# Patient Record
Sex: Female | Born: 1995 | Race: White | Hispanic: No | Marital: Single | Smoking: Never smoker
Health system: Southern US, Community
[De-identification: ages and names within clinical notes are randomized; demographics above are authoritative.]

## PROBLEM LIST (undated history)

## (undated) HISTORY — PX: WISDOM TOOTH EXTRACTION: SHX21

---

## 2014-06-14 ENCOUNTER — Emergency Department (HOSPITAL_BASED_OUTPATIENT_CLINIC_OR_DEPARTMENT_OTHER): Payer: BC Managed Care – PPO

## 2014-06-14 ENCOUNTER — Emergency Department (HOSPITAL_BASED_OUTPATIENT_CLINIC_OR_DEPARTMENT_OTHER)
Admission: EM | Admit: 2014-06-14 | Discharge: 2014-06-15 | Disposition: A | Discharge status: Home / Self Care | Payer: BC Managed Care – PPO | Attending: Emergency Medicine | Admitting: Emergency Medicine

## 2014-06-14 ENCOUNTER — Encounter (HOSPITAL_BASED_OUTPATIENT_CLINIC_OR_DEPARTMENT_OTHER): Payer: Self-pay | Admitting: Emergency Medicine

## 2014-06-14 DIAGNOSIS — Z3202 Encounter for pregnancy test, result negative: Secondary | ICD-10-CM | POA: Insufficient documentation

## 2014-06-14 DIAGNOSIS — R1031 Right lower quadrant pain: Secondary | ICD-10-CM

## 2014-06-14 DIAGNOSIS — K5904 Chronic idiopathic constipation: Secondary | ICD-10-CM

## 2014-06-14 DIAGNOSIS — Z880 Allergy status to penicillin: Secondary | ICD-10-CM | POA: Insufficient documentation

## 2014-06-14 LAB — URINALYSIS, ROUTINE W REFLEX MICROSCOPIC
BILIRUBIN URINE: NEGATIVE
Glucose, UA: NEGATIVE mg/dL
Hgb urine dipstick: NEGATIVE
Ketones, ur: NEGATIVE mg/dL
Leukocytes, UA: NEGATIVE
NITRITE: NEGATIVE
PH: 6 (ref 5.0–8.0)
Protein, ur: NEGATIVE mg/dL
SPECIFIC GRAVITY, URINE: 1.027 (ref 1.005–1.030)
Urobilinogen, UA: 1 mg/dL (ref 0.0–1.0)

## 2014-06-14 LAB — PREGNANCY, URINE: Preg Test, Ur: NEGATIVE

## 2014-06-14 NOTE — ED Notes (Signed)
Pt c/o right lower abd pain x 2 days  Pt denies n/v or fever urinary symptoms.

## 2014-06-14 NOTE — ED Provider Notes (Signed)
CSN: [id number]     Arrival date & time 06/14/14  2013 History   This chart was scribed for Dagmar HaitWilliam Laverta Harnisch, MD, by Yevette EdwardsAngela Bracken, ED Scribe. This patient was seen in room MH09/MH09 and the patient's care was started at 9:51 PM.  First MD Initiated Contact with Patient 06/14/14 2141     Chief Complaint  Patient presents with  . Abdominal Pain    Patient is a 18 y.o. female presenting with abdominal pain. The history is provided by the patient. No language interpreter was used.  Abdominal Pain Pain location:  RLQ Pain quality: sharp   Pain severity:  Mild Onset quality:  Sudden Duration:  2 days Timing:  Rare Progression:  Improving Chronicity:  New Context: not recent sexual activity and not retching   Relieved by:  Nothing Worsened by:  Nothing tried Ineffective treatments:  None tried Associated symptoms: no dysuria, no fever, no hematuria, no nausea and no vomiting   Risk factors: not elderly, not obese and not pregnant    HPI Comments: Kathryn Jenkins is a 18 y.o. female who presents to the Emergency Department complaining of RLQ abdominal pain which first occurred yesterday evening and which she characterizes as "sharp." The pain resolved spontaneously yesterday evening, and then returned suddenly this evening after micturition. She reports two locations of pain this evening, one in the RLQ which she characterizes as a mild, "throbbing" pain similar to yesterday evening's pain. She also experienced "burning," severe pain inferior to her umbilicus.  Kathryn Jenkins reports the pain is increased with deep inspiration, but the pain is currently improving at bedside. She denies radiation of the pain to her back or groin. The pt denies nausea, emesis, fever, diarrhea, or urinary symptoms. She denies a h/o similar symptoms. The pt has not had a menses for four months, and she reports a prior episode of dysmenorrhea two years ago and possible POCT. Kathryn Jenkins denies sexual activity, and she  denies a h/o pelvic exams.   History reviewed. No pertinent past medical history. History reviewed. No pertinent past surgical history. History reviewed. No pertinent family history. History  Substance Use Topics  . Smoking status: Never Smoker   . Smokeless tobacco: Not on file  . Alcohol Use: No   No OB history provided.  Review of Systems  Constitutional: Negative for fever.  Gastrointestinal: Positive for abdominal pain. Negative for nausea and vomiting.  Genitourinary: Negative for dysuria, hematuria and vaginal pain.  Musculoskeletal: Negative for back pain.  All other systems reviewed and are negative.   Allergies  Penicillins  Home Medications   Prior to Admission medications   Not on File   Triage Vitals: BP 123/63  Pulse 73  Temp(Src) 98 F (36.7 C) (Oral)  Resp 18  Ht 5' 5.5" (1.664 m)  Wt 150 lb (68.04 kg)  BMI 24.57 kg/m2  SpO2 99%  Physical Exam  Nursing note and vitals reviewed. Constitutional: She is oriented to person, place, and time. She appears well-developed and well-nourished. No distress.  HENT:  Head: Normocephalic and atraumatic.  Eyes: Conjunctivae and EOM are normal.  Neck: Neck supple. No tracheal deviation present.  Cardiovascular: Normal rate.   Pulmonary/Chest: Effort normal. No respiratory distress.  Musculoskeletal: Normal range of motion.  Neurological: She is alert and oriented to person, place, and time.  Skin: Skin is warm and dry.  Psychiatric: She has a normal mood and affect. Her behavior is normal.    ED Course  Procedures (including critical care  time)  DIAGNOSTIC STUDIES: Oxygen Saturation is 99% on room air, normal by my interpretation.    COORDINATION OF CARE:  10:06 PM- Discussed treatment plan with patient, and the patient agreed to the plan. The plan includes an ultrasound and CT if [place] is negative.   Labs Review Labs Reviewed  URINALYSIS, ROUTINE W REFLEX MICROSCOPIC  PREGNANCY, URINE    Imaging  Review US Pelvis Complete  06/15/2014   CLINICAL DATA:  Sudden onset of right lower quadrant abdominal pain and pelvic pain.  EXAM: TRANSABDOMINAL ULTRASOUND OF PELVIS  DOPPLER ULTRASOUND OF OVARIES  TECHNIQUE: Transabdominal ultrasound examination of the pelvis was performed including evaluation of the uterus, ovaries, adnexal regions, and pelvic cul-de-sac.  Color and duplex Doppler ultrasound was utilized to evaluate blood flow to the ovaries.  COMPARISON:  None.  FINDINGS: Uterus  Measurements: 8.2 x 3.9 x 4.3 cm No fibroids or other mass visualized.  Endometrium  Thickness: 1.7 cm.  No focal abnormality visualized.  Right ovary  Measurements: 4.1 x 1.8 x 2.5 cm Normal appearance/no adnexal mass.  Left ovary  Measurements: 4.0 x 2.2 x 3.1 cm There appears to be a partially decompressed follicle on the left side.  Pulsed Doppler evaluation demonstrates normal low-resistance arterial and venous waveforms in both ovaries.  Trace free fluid is noted at both adnexa.  IMPRESSION: 1. Unremarkable pelvic ultrasound.  No evidence for ovarian torsion. 2. Trace free fluid at both adnexa, likely physiologic in nature.   Electronically Signed   By: Roanna Raider M.D.   On: 06/15/2014 00:22   [place] Art/ven Flow Abd Pelv Doppler  06/15/2014   CLINICAL DATA:  Sudden onset of right lower quadrant abdominal pain and pelvic pain.  EXAM: TRANSABDOMINAL ULTRASOUND OF PELVIS  DOPPLER ULTRASOUND OF OVARIES  TECHNIQUE: Transabdominal ultrasound examination of the pelvis was performed including evaluation of the uterus, ovaries, adnexal regions, and pelvic cul-de-sac.  Color and duplex Doppler ultrasound was utilized to evaluate blood flow to the ovaries.  COMPARISON:  None.  FINDINGS: Uterus  Measurements: 8.2 x 3.9 x 4.3 cm No fibroids or other mass visualized.  Endometrium  Thickness: 1.7 cm.  No focal abnormality visualized.  Right ovary  Measurements: 4.1 x 1.8 x 2.5 cm Normal appearance/no adnexal mass.  Left ovary   Measurements: 4.0 x 2.2 x 3.1 cm There appears to be a partially decompressed follicle on the left side.  Pulsed Doppler evaluation demonstrates normal low-resistance arterial and venous waveforms in both ovaries.  Trace free fluid is noted at both adnexa.  IMPRESSION: 1. Unremarkable pelvic ultrasound.  No evidence for ovarian torsion. 2. Trace free fluid at both adnexa, likely physiologic in nature.   Electronically Signed   By: Roanna Raider M.D.   On: 06/15/2014 00:22     EKG Interpretation None      MDM   Final diagnoses:  Right lower quadrant pain    69F here with RLQ pain. Episodic, happened last night, then again tonight. Tonight acutely happened, had her doubled over in pain. No dysuria, no N/V/D. No fever. Here low RLQ pain on exam. Deferred pelvic, had never had one before. Pelvic US normal, will scan to r/o appendicitis. Care to Dr. Nicanor Alcon.  I personally performed the services described in this documentation, which was scribed in my presence. The recorded information has been reviewed and is accurate.     Dagmar Hait, MD 06/15/14 [phone]

## 2014-06-14 NOTE — ED Notes (Signed)
Pt given 1000cc water to drink prior to US per EDP orders

## 2014-06-15 ENCOUNTER — Emergency Department (HOSPITAL_BASED_OUTPATIENT_CLINIC_OR_DEPARTMENT_OTHER): Payer: BC Managed Care – PPO

## 2014-06-15 LAB — BASIC METABOLIC PANEL
Anion gap: 12 (ref 5–15)
BUN: 15 mg/dL (ref 6–23)
CHLORIDE: 104 meq/L (ref 96–112)
CO2: 25 mEq/L (ref 19–32)
Calcium: 9.3 mg/dL (ref 8.4–10.5)
Creatinine, Ser: 0.9 mg/dL (ref 0.50–1.10)
GFR calc Af Amer: >90 mL/min (ref >90)
GFR calc non Af Amer: >90 mL/min (ref >90)
Glucose, Bld: 92 mg/dL (ref 70–99)
POTASSIUM: 3.7 meq/L (ref 3.7–5.3)
SODIUM: 141 meq/L (ref 137–147)

## 2014-06-15 LAB — CBC
HCT: 37.3 % (ref 36.0–46.0)
Hemoglobin: 12.7 g/dL (ref 12.0–15.0)
MCH: 27.1 pg (ref 26.0–34.0)
MCHC: 34 g/dL (ref 30.0–36.0)
MCV: 79.5 fL (ref 78.0–100.0)
PLATELETS: 241 10*3/uL (ref 150–400)
RBC: 4.69 MIL/uL (ref 3.87–5.11)
RDW: 16.2 % — ABNORMAL HIGH (ref 11.5–15.5)
WBC: 6.1 10*3/uL (ref 4.0–10.5)

## 2014-06-15 MED ORDER — IOHEXOL 300 MG/ML  SOLN
100.0000 mL | Freq: Once | INTRAMUSCULAR | Status: AC | PRN
  Administered 2014-06-15: 100 mL via INTRAVENOUS

## 2014-06-15 MED ORDER — NAPROXEN 375 MG PO TABS
375.0000 mg | ORAL_TABLET | Freq: Two times a day (BID) | ORAL | Status: DC
Start: 2014-06-15 — End: 2015-06-21

## 2014-06-15 MED ORDER — SODIUM CHLORIDE 0.9 % IV BOLUS (SEPSIS)
500.0000 mL | Freq: Once | INTRAVENOUS | Status: AC
  Administered 2014-06-15: 500 mL via INTRAVENOUS

## 2014-06-15 MED ORDER — IOHEXOL 300 MG/ML  SOLN
50.0000 mL | Freq: Once | INTRAMUSCULAR | Status: AC | PRN
  Administered 2014-06-15: 50 mL via ORAL

## 2014-06-15 NOTE — Discharge Instructions (Signed)

## 2015-06-21 ENCOUNTER — Ambulatory Visit (INDEPENDENT_AMBULATORY_CARE_PROVIDER_SITE_OTHER): Payer: 59 | Admitting: Obstetrics and Gynecology

## 2015-06-21 ENCOUNTER — Encounter: Payer: Self-pay | Admitting: Obstetrics and Gynecology

## 2015-06-21 VITALS — BP 110/70 | HR 64 | Resp 14 | Ht 65.0 in | Wt 160.0 lb

## 2015-06-21 DIAGNOSIS — N915 Oligomenorrhea, unspecified: Secondary | ICD-10-CM

## 2015-06-21 DIAGNOSIS — L680 Hirsutism: Secondary | ICD-10-CM | POA: Insufficient documentation

## 2015-06-21 DIAGNOSIS — Z01419 Encounter for gynecological examination (general) (routine) without abnormal findings: Secondary | ICD-10-CM | POA: Diagnosis not present

## 2015-06-21 MED ORDER — DROSPIRENONE-ETHINYL ESTRADIOL 3-0.02 MG PO TABS: 1.0000 | ORAL_TABLET | Freq: Every day | ORAL | Status: DC

## 2015-06-21 NOTE — Patient Instructions (Addendum)
Call if you go 2 months without a cycle  EXERCISE AND DIET:  We recommended that you start or continue a regular exercise program for good health. Regular exercise means any activity that makes your heart beat faster and makes you sweat.  We recommend exercising at least 30 minutes per day at least 3 days a week, preferably 4 or 5.  We also recommend a diet low in fat and sugar.  Inactivity, poor dietary choices and obesity can cause diabetes, heart attack, stroke, and kidney damage, among others.    ALCOHOL AND SMOKING:  Women should limit their alcohol intake to no more than 7 drinks/beers/glasses of wine (combined, not each!) per week. Moderation of alcohol intake to this level decreases your risk of breast cancer and liver damage. And of course, no recreational drugs are part of a healthy lifestyle.  And absolutely no smoking or even second hand smoke. Most people know smoking can cause heart and lung diseases, but did you know it also contributes to weakening of your bones? Aging of your skin?  Yellowing of your teeth and nails?  CALCIUM AND VITAMIN D:  Adequate intake of calcium and Vitamin D are recommended.  The recommendations for exact amounts of these supplements seem to change often, but generally speaking 600 mg of calcium (either carbonate or citrate) and 800 units of Vitamin D per day seems prudent. Certain women may benefit from higher intake of Vitamin D.  If you are among these women, your doctor will have told you during your visit.    PAP SMEARS:  Pap smears, to check for cervical cancer or precancers,  have traditionally been done yearly, although recent scientific advances have shown that most women can have pap smears less often.  However, every woman still should have a physical exam from her gynecologist every year. It will include a breast check, inspection of the vulva and vagina to check for abnormal growths or skin changes, a visual exam of the cervix, and then an exam to  evaluate the size and shape of the uterus and ovaries.  And after 20 years of age, a rectal exam is indicated to check for rectal cancers. We will also provide age appropriate advice regarding health maintenance, like when you should have certain vaccines, screening for sexually transmitted diseases, bone density testing, colonoscopy, mammograms, etc.   Oral Contraception Information Oral contraceptive pills (OCPs) are medicines taken to prevent pregnancy. OCPs work by preventing the ovaries from releasing eggs. The hormones in OCPs also cause the cervical mucus to thicken, preventing the sperm from entering the uterus. The hormones also cause the uterine lining to become thin, not allowing a fertilized egg to attach to the inside of the uterus. OCPs are highly effective when taken exactly as prescribed. However, OCPs do not prevent sexually transmitted diseases (STDs). Safe sex practices, such as using condoms along with the pill, can help prevent STDs.  Before taking the pill, you may have a physical exam and Pap test. Your health care provider may order blood tests. The health care provider will make sure you are a good candidate for oral contraception. Discuss with your health care provider the possible side effects of the OCP you may be prescribed. When starting an OCP, it can take 2 to 3 months for the body to adjust to the changes in hormone levels in your body.  TYPES OF ORAL CONTRACEPTION  The combination pill--This pill contains estrogen and progestin (synthetic progesterone) hormones. The combination pill comes  in 21-day, 28-day, or 91-day packs. Some types of combination pills are meant to be taken continuously (365-day pills). With 21-day packs, you do not take pills for 7 days after the last pill. With 28-day packs, the pill is taken every day. The last 7 pills are without hormones. Certain types of pills have more than 21 hormone-containing pills. With 91-day packs, the first 84 pills contain  both hormones, and the last 7 pills contain no hormones or contain estrogen only.  The minipill--This pill contains the progesterone hormone only. The pill is taken every day continuously. It is very important to take the pill at the same time each day. The minipill comes in packs of 28 pills. All 28 pills contain the hormone.  ADVANTAGES OF ORAL CONTRACEPTIVE PILLS  Decreases premenstrual symptoms.   Treats menstrual period cramps.   Regulates the menstrual cycle.   Decreases a heavy menstrual flow.   May treatacne, depending on the type of pill.   Treats abnormal uterine bleeding.   Treats polycystic ovarian syndrome.   Treats endometriosis.   Can be used as emergency contraception.  THINGS THAT CAN MAKE ORAL CONTRACEPTIVE PILLS LESS EFFECTIVE OCPs can be less effective if:   You forget to take the pill at the same time every day.   You have a stomach or intestinal disease that lessens the absorption of the pill.   You take OCPs with other medicines that make OCPs less effective, such as antibiotics, certain HIV medicines, and some seizure medicines.   You take expired OCPs.   You forget to restart the pill on day 7, when using the packs of 21 pills.  RISKS ASSOCIATED WITH ORAL CONTRACEPTIVE PILLS  Oral contraceptive pills can sometimes cause side effects, such as:  Headache.  Nausea.  Breast tenderness.  Irregular bleeding or spotting. Combination pills are also associated with a small increased risk of:  Blood clots.  Heart attack.  Stroke. Document Released: 01/31/2003 Document Revised: 08/31/2013 Document Reviewed: 05/01/2013 [facility] Patient Information 2015 [place], [place]. This information is not intended to replace advice given to you by your health care provider. Make sure you discuss any questions you have with your health care provider.  Polycystic Ovarian Syndrome Polycystic ovarian syndrome (PCOS) is a common hormonal disorder  among women of reproductive age. Most women with PCOS grow many small cysts on their ovaries. PCOS can cause problems with your periods and make it difficult to get pregnant. It can also cause an increased risk of miscarriage with pregnancy. If left untreated, PCOS can lead to serious health problems, such as diabetes and heart disease. CAUSES The cause of PCOS is not fully understood, but genetics may be a factor. SIGNS AND SYMPTOMS   Infrequent or no menstrual periods.   Inability to get pregnant (infertility) because of not ovulating.   Increased growth of hair on the face, chest, stomach, back, thumbs, thighs, or toes.   Acne, oily skin, or dandruff.   Pelvic pain.   Weight gain or obesity, usually carrying extra weight around the waist.   Type 2 diabetes.   High cholesterol.   High blood pressure.   Female-pattern baldness or thinning hair.   Patches of thickened and dark brown or black skin on the neck, arms, breasts, or thighs.   Tiny excess flaps of skin (skin tags) in the armpits or neck area.   Excessive snoring and having breathing stop at times while asleep (sleep apnea).   Deepening of the voice.   Gestational  diabetes when pregnant.  DIAGNOSIS  There is no single test to diagnose PCOS.   Your health care provider will:   Take a medical history.   Perform a pelvic exam.   Have ultrasonography done.   Check your female and female hormone levels.   Measure glucose or sugar levels in the blood.   Do other blood tests.   If you are producing too many female hormones, your health care provider will make sure it is from PCOS. At the physical exam, your health care provider will want to evaluate the areas of increased hair growth. Try to allow natural hair growth for a few days before the visit.   During a pelvic exam, the ovaries may be enlarged or swollen because of the increased number of small cysts. This can be seen more easily by  using vaginal ultrasonography or screening to examine the ovaries and lining of the uterus (endometrium) for cysts. The uterine lining may become thicker if you have not been having a regular period.  TREATMENT  Because there is no cure for PCOS, it needs to be managed to prevent problems. Treatments are based on your symptoms. Treatment is also based on whether you want to have a baby or whether you need contraception.  Treatment may include:   Progesterone hormone to start a menstrual period.   Birth control pills to make you have regular menstrual periods.   Medicines to make you ovulate, if you want to get pregnant.   Medicines to control your insulin.   Medicine to control your blood pressure.   Medicine and diet to control your high cholesterol and triglycerides in your blood.  Medicine to reduce excessive hair growth.  Surgery, making small holes in the ovary, to decrease the amount of female hormone production. This is done through a long, lighted tube (laparoscope) placed into the pelvis through a tiny incision in the lower abdomen.  HOME CARE INSTRUCTIONS  Only take over-the-counter or prescription medicine as directed by your health care provider.  Pay attention to the foods you eat and your activity levels. This can help reduce the effects of PCOS.  Keep your weight under control.  Eat foods that are low in carbohydrate and high in fiber.  Exercise regularly. SEEK MEDICAL CARE IF:  Your symptoms do not get better with medicine.  You have new symptoms. Document Released: 03/06/2005 Document Revised: 08/31/2013 Document Reviewed: 04/28/2013 [facility] Patient Information 2015 [place], [place]. This information is not intended to replace advice given to you by your health care provider. Make sure you discuss any questions you have with your health care provider.

## 2015-06-21 NOTE — Progress Notes (Signed)
Patient ID: Kathryn Jenkins, female   DOB: 17-Sep-1996, 19 y.o.   MRN: [id number] 19 y.o. No obstetric history on file. Single Caucasian female here for annual exam.  Pt c/o irregular menstrual cycles and weight gain. She also has noticed increased facial hair which concerns her. Menarche at age 70, slightly irregularly initially. She was on OCP's 8/15-12/16 for cycle control. When she went off menses q 4-5 weeks, then went 2 months in between cycles. Bleeds x 6-7 days. Saturates a super tampon in up to 2-3 hours. No breakthrough bleeding. Mild cramps, some back pain at times, tolerable.  Never sexually active. S/P gardasil x 3. She c/o hair growth on her chin, worsening in the last few years. She plucks or cuts it. No other concerning hair growth. She does c/o acne since puberty. She has gained about 26 lbs in the last 2 years. Mild headaches, no visual changes.  When she was on OCP's her Mom felt she was more moody.     PCP: Boneta Lucks, NP   Patient's last menstrual period was 06/11/2015.          Sexually active: No.  The current method of family planning is none.    Exercising: Yes.    strength training  3-4 x a week Smoker:  no  Health Maintenance: Pap:  N/A History of abnormal Pap:  N?A MMG:  N/A Colonoscopy:  N/A BMD:   N/A TDaP:  2007  Junior in college, biology major, wants to PA school.    reports that she has never smoked. She has never used smokeless tobacco. She reports that she does not drink alcohol or use illicit drugs.  History reviewed. No pertinent past medical history.  Past Surgical History  Procedure Laterality Date  . Wisdom tooth extraction      No current outpatient prescriptions on file.   No current facility-administered medications for this visit.    Family History  Problem Relation Age of Onset  . Thyroid disease Mother   . Thyroid disease Father   . Colon polyps Father   . Diabetes Paternal Aunt   . Diabetes Maternal Grandfather     ROS:   Pertinent items are noted in HPI.  Otherwise, a comprehensive ROS was negative.  Exam:   BP 110/70 mmHg  Pulse 64  Resp 14  Ht  (1.651 m)  Wt 160 lb (72.576 kg)  BMI 26.63 kg/m2  LMP 06/11/2015    General appearance: alert, cooperative and appears stated age Skin: moderate hirsutism under her chin, mild hirsutism on her lower abdomen. Acne noted on her face and back.  Head: Normocephalic, without obvious abnormality, atraumatic Neck: no adenopathy, supple, symmetrical, trachea midline and thyroid normal to inspection and palpation Lungs: clear to auscultation bilaterally Breasts: normal appearance, no masses or tenderness Heart: regular rate and rhythm Abdomen: soft, non-tender; bowel sounds normal; no masses,  no organomegaly Extremities: extremities normal, atraumatic, no cyanosis or edema Skin: Skin color, texture, turgor normal. No rashes or lesions Lymph nodes: Cervical, supraclavicular, and axillary nodes normal. No abnormal inguinal nodes palpated Neurologic: Grossly normal Pelvic: deferred  Assessment:   Well woman visit with normal exam. Acne Hirsutism Oligomenorrhea Suspect PCOS  Plan: TSH and prolactin  Hirsutism labs Discussed OCP's, can help with cycle control, acne and help prevent worsening hirsutism Discussed Yaz, including potentially increased risk of clotting compared to other OCP's, no contraindication Start Yaz F/U in 3 months Use condoms if she does become sexually active After visit summary  provided.

## 2015-06-22 LAB — DHEA-SULFATE: DHEA-SO4: 208 ug/dL (ref 51–321)

## 2015-06-22 LAB — TESTOSTERONE: Testosterone: 62 ng/dL (ref 10–70)

## 2015-06-22 LAB — TSH: TSH: 1.581 u[IU]/mL (ref 0.350–4.500)

## 2015-06-22 LAB — PROLACTIN: Prolactin: 18.9 ng/mL

## 2015-06-25 LAB — 17-HYDROXYPROGESTERONE: 17-OH-Progesterone, LC/MS/MS: 34 ng/dL

## 2015-09-10 ENCOUNTER — Encounter: Payer: Self-pay | Admitting: Obstetrics and Gynecology

## 2015-09-10 ENCOUNTER — Ambulatory Visit (INDEPENDENT_AMBULATORY_CARE_PROVIDER_SITE_OTHER): Payer: 59 | Admitting: Obstetrics and Gynecology

## 2015-09-10 VITALS — BP 114/82 | HR 66 | Resp 14 | Wt 150.0 lb

## 2015-09-10 DIAGNOSIS — Z3041 Encounter for surveillance of contraceptive pills: Secondary | ICD-10-CM | POA: Diagnosis not present

## 2015-09-10 MED ORDER — DROSPIRENONE-ETHINYL ESTRADIOL 3-0.02 MG PO TABS: 1.0000 | ORAL_TABLET | Freq: Every day | ORAL | Status: DC

## 2015-09-10 NOTE — Progress Notes (Signed)
Patient ID: Kathryn Jenkins, female   DOB: 07/13/1996, 19 y.o.   MRN: [id number] GYNECOLOGY  VISIT   HPI: 19 y.o.   Single  Caucasian  female   No obstetric history on file. with Patient's last menstrual period was 08/20/2015.   here for 3 month follow up birth control. When she was seen at her annual exam 3 months ago, she c/o oligomenorrhea and hirsutism. Her labs were normal. Never sexually active. She was started on Yaz. She is at the end of her 3rd pack. Doing well. She had cycles the first 2 months, bleed x 5-7 days. Moderate flow, changing a tampon in 3-4 hours (super). Slight spotting, getting better. No cramps. No mood changes. She has lost ablout 15 lbs in the last 3 months with dietary changes and exercise.  She has a boyfriend, considering being sexually active.   GYNECOLOGIC HISTORY: Patient's last menstrual period was 08/20/2015. Contraception:OCP Menopausal hormone therapy: N/A        OB History    No data available         Patient Active Problem List   Diagnosis Date Noted  . 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 Hirsutism 06/21/2015    History reviewed. No pertinent past medical history.  Past Surgical History  Procedure Laterality Date  . Wisdom tooth extraction      Current Outpatient Prescriptions  Medication Sig Dispense Refill  . drospirenone-ethinyl estradiol (YAZ,GIANVI,LORYNA) 3-0.02 MG tablet Take 1 tablet by mouth daily. 3 Package 2   No current facility-administered medications for this visit.     ALLERGIES: Penicillins  Family History  Problem Relation Age of Onset  . Thyroid disease Mother   . Thyroid disease Father   . Colon polyps Father   . Diabetes Paternal Aunt   . Diabetes Maternal Grandfather     Social History   Social History  . Marital Status: Single    Spouse Name: N/A  . Number of Children: N/A  . Years of Education: N/A   Occupational History  . Not on file.   Social History Main Topics  . Smoking status: Never Smoker   .  Smokeless tobacco: Never Used  . Alcohol Use: No  . Drug Use: No  . Sexual Activity: No   Other Topics Concern  . Not on file   Social History Narrative    Review of Systems  Gastrointestinal: Negative for abdominal pain.  Genitourinary: Negative for flank pain.  All other systems reviewed and are negative.   PHYSICAL EXAMINATION:    BP 114/82 mmHg  Pulse 66  Resp 14  Wt 150 lb (68.04 kg)  LMP 08/20/2015    General appearance: alert, cooperative and appears stated age  ASSESSMENT H/o oligomenorrhea and hirsutism, normal labs Here for f/u on OCP's, doing well, no c/o Considering being sexually active for the first time   PLAN Continue OCP's Discussed condoms, discussed using a lubricant Recommend her partner be tested for STD's prior to becoming sexually active S/P gardasil x 3   An After Visit Summary was printed and given to the patient.

## 2015-11-14 ENCOUNTER — Telehealth: Payer: Self-pay | Admitting: Obstetrics and Gynecology

## 2015-11-14 ENCOUNTER — Encounter: Payer: Self-pay | Admitting: Obstetrics and Gynecology

## 2015-11-14 NOTE — Telephone Encounter (Signed)
I agree, she should take a pregnancy test. As long as it is negative she should just continue her OCP's. She may have BTB the rest of this cycle.

## 2015-11-14 NOTE — Telephone Encounter (Signed)
Patient has missed two active birth control pill and is now having BTB. Patient is wondering if it is possible if she could be pregnant? Patient has not taken any OTC pregnancy test.

## 2015-11-14 NOTE — Telephone Encounter (Signed)
Spoke with patient at time of incoming call. [place]. Patient is currently taking Yaz for OCP. Patient states that two weeks ago she had intercourse 12/3. Used a condom as back up method.Patient took her pill after intercourse on 12/3. Missed pills on 12/4 and 12/5. Doubled up on her pills on 12/6 then was due to start a new pack. Threw out the last four pills in her pack and started a new pack with the first active pill. Has not missed any pills or taken any pills late since. Did not miss or take any pills late prior to intercourse. Patient began to have break through bleeding a couple of days ago. At first was bleeding like a normal cycle now states that the bleeding has slowed and has almost stopped. Patient is concerned about possible pregnancy. "He used a condom and it did not break, but he did have pre-cum on him before and my body was on his." Advised patient if there is any concern for pregnancy she needs to take a UPT. If pregnancy test if negative okay to continue taking OCP at the same time daily and not to miss any pills. If positive will need to contact our office. Patient is agreeable. All questions answered. Advised I will also speak with Dr.Jertson and return call with any additional information. Patient requests a mychart message be sent to her as she is unable to receive calls where she is at.

## 2015-11-14 NOTE — Telephone Encounter (Signed)
Please see other mychart encounter dated in today's date 11/14/2015 as this addresses the questions that are in this mychart encounter. Will close encounter.

## 2015-11-14 NOTE — Telephone Encounter (Signed)
Mychart message sent to patient per request as she is out of the country as seen below.  Kathryn Jenkins,  My name is Kathryn Jenkins. I am one of the triage nurses in the office with Dr.Jertson. We spoke earlier today. I am sending you a message to let you know I spoke with Dr.Jertson who also recommends you take a urine pregnancy test. If this is negative it is okay to continue taking your birth control as directed. Due to missing two pills it is not uncommon for you to have break through bleeding which may continue off and on during the rest of this pack of pills. If you have any additional questions please contact our office at [phone].  Thank you, Kathryn McalpineKaitlyn Hines, RN  Routing to provider for final review. Patient agreeable to disposition. Will close encounter.

## 2015-11-28 ENCOUNTER — Telehealth: Payer: Self-pay | Admitting: Obstetrics and Gynecology

## 2015-11-28 NOTE — Telephone Encounter (Signed)
Spoke with patient. Patient states that she is currently taking YAZ for birth control. Has been experiencing irregular BTB for one month. Has take a UPT which was negative. Patient would like to discuss alternative birth control options due to BTB and school schedule which makes it hard to take a pill. Advsied she will need to be seen in the office for a consultation. Patient is agreeable. Will return to college on 12/02/2015. Appointment scheduled for 11/29/2015 at 11:30 am with Dr.Jertson. Agreeable to date and time.  Routing to provider for final review. Patient agreeable to disposition. Will close encounter.

## 2015-11-28 NOTE — Telephone Encounter (Signed)
Patient is having some break through bleeeding on her current birth control. Patient is asking to talk with Dr.Jertson for alternative forms of birth control. Patient she may be interested in "the shot". Last seen 09/10/15.

## 2015-11-29 ENCOUNTER — Other Ambulatory Visit: Payer: Self-pay | Admitting: *Deleted

## 2015-11-29 ENCOUNTER — Ambulatory Visit (INDEPENDENT_AMBULATORY_CARE_PROVIDER_SITE_OTHER): Payer: 59 | Admitting: Obstetrics and Gynecology

## 2015-11-29 ENCOUNTER — Telehealth: Payer: Self-pay | Admitting: Obstetrics and Gynecology

## 2015-11-29 ENCOUNTER — Encounter: Payer: Self-pay | Admitting: Obstetrics and Gynecology

## 2015-11-29 VITALS — BP 122/70 | HR 108 | Resp 16 | Wt 155.0 lb

## 2015-11-29 DIAGNOSIS — Z308 Encounter for other contraceptive management: Secondary | ICD-10-CM

## 2015-11-29 DIAGNOSIS — Z3009 Encounter for other general counseling and advice on contraception: Secondary | ICD-10-CM

## 2015-11-29 MED ORDER — ETONOGESTREL 68 MG ~~LOC~~ IMPL: 1.0000 | DRUG_IMPLANT | Freq: Once | SUBCUTANEOUS | Status: DC

## 2015-11-29 NOTE — Progress Notes (Signed)
Patient ID: Kathryn Jenkins, female   DOB: 03/20/1996, 20 y.o.   MRN: [id number] GYNECOLOGY  VISIT   HPI: 20 y.o.   Single  Caucasian  female   No obstetric history on file. with Patient's last menstrual period was 10/17/2015.   here to discuss birth control options beside OCP. She is having problems remembering to take her OCP. She takes the pills at different times of day, intermittent spotting. In 12/16 she forgot 2 pills at the beginning of the month, and has been on the pill continuously since. She has been spotting every day. Negative UPT 2 weeks ago.  She is sexually active, using condoms.  She has been under a lot of stress, her grandmother died last week, finals, other issues.   GYNECOLOGIC HISTORY: Patient's last menstrual period was 10/17/2015. Contraception:OCP Menopausal hormone therapy: None        OB History    No data available         Patient Active Problem List   Diagnosis Date Noted  . 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 Hirsutism 06/21/2015    History reviewed. No pertinent past medical history.  Past Surgical History  Procedure Laterality Date  . Wisdom tooth extraction      Current Outpatient Prescriptions  Medication Sig Dispense Refill  . drospirenone-ethinyl estradiol (YAZ,GIANVI,LORYNA) 3-0.02 MG tablet Take 1 tablet by mouth daily. 3 Package 2   No current facility-administered medications for this visit.     ALLERGIES: Penicillins  Family History  Problem Relation Age of Onset  . Thyroid disease Mother   . Thyroid disease Father   . Colon polyps Father   . Diabetes Paternal Aunt   . Diabetes Maternal Grandfather     Social History   Social History  . Marital Status: Single    Spouse Name: N/A  . Number of Children: N/A  . Years of Education: N/A   Occupational History  . Not on file.   Social History Main Topics  . Smoking status: Never Smoker   . Smokeless tobacco: Never Used  . Alcohol Use: No  . Drug Use: No  . Sexual Activity: No    Other Topics Concern  . Not on file   Social History Narrative    Review of Systems  Constitutional: Negative.   HENT: Negative.   Respiratory: Negative.   Cardiovascular: Negative.   Gastrointestinal: Negative.   Genitourinary: Negative.   Musculoskeletal: Negative.   Skin: Negative.   Neurological: Negative.   Endo/Heme/Allergies: Negative.   Psychiatric/Behavioral: Negative.     PHYSICAL EXAMINATION:    BP 122/70 mmHg  Pulse 108  Resp 16  Wt 155 lb (70.308 kg)  LMP 10/17/2015    General appearance: alert, cooperative and appears stated age   ASSESSMENT Contraception management. Patient having issues with remembering her OCP's and having BTB. She has taken it continuously and correctly for the last 3 weeks. Reviewed options of depo-provera, nexplanon, and IUD's (hormonal and non-hormonal) She would like the nexplanon, side effects reviewed. Information given    PLAN She will return for the nexplanon insertion Will need a UPT prior to insertion   An After Visit Summary was printed and given to the patient.  15 minutes face to face time of which over 50% was spent in counseling.

## 2015-11-29 NOTE — Telephone Encounter (Signed)
Patient returning call.

## 2015-11-29 NOTE — Telephone Encounter (Signed)
Call to patient to advise of benefits for Nexplanon insertion. Left message on machine for patient to call back.

## 2015-11-29 NOTE — Addendum Note (Signed)
Addended by: Tobi BastosJERTSON, Tasean Mancha E on: 11/29/2015 05:31 PM   Modules accepted: Orders

## 2015-11-30 ENCOUNTER — Ambulatory Visit (INDEPENDENT_AMBULATORY_CARE_PROVIDER_SITE_OTHER): Payer: 59 | Admitting: Obstetrics and Gynecology

## 2015-11-30 ENCOUNTER — Encounter: Payer: Self-pay | Admitting: Obstetrics and Gynecology

## 2015-11-30 VITALS — BP 122/60 | HR 72 | Resp 16 | Ht 65.0 in | Wt 155.0 lb

## 2015-11-30 DIAGNOSIS — Z308 Encounter for other contraceptive management: Secondary | ICD-10-CM | POA: Diagnosis not present

## 2015-11-30 DIAGNOSIS — Z113 Encounter for screening for infections with a predominantly sexual mode of transmission: Secondary | ICD-10-CM | POA: Diagnosis not present

## 2015-11-30 DIAGNOSIS — Z30017 Encounter for initial prescription of implantable subdermal contraceptive: Secondary | ICD-10-CM

## 2015-11-30 LAB — POCT URINE PREGNANCY: PREG TEST UR: NEGATIVE

## 2015-11-30 NOTE — Telephone Encounter (Signed)
Patient came in for appointment today, patient informed of insurance benefits at check in.   Routing to provider for final review. Patient agreeable to disposition. Will close encounter.

## 2015-11-30 NOTE — Patient Instructions (Signed)
Take your outer bandage off in 24 hours, leave the inner bandage on for 1 week. Call with any questions or concerns

## 2015-11-30 NOTE — Progress Notes (Signed)
GYNECOLOGY  VISIT   HPI: 20 y.o.   Single  Caucasian  female   No obstetric history on file. with Patient's last menstrual period was 11/29/2015.   here for Nexplanon Insertion. Not liking OCP's, using condoms. Only recently started being sexually active.   GYNECOLOGIC HISTORY: Patient's last menstrual period was 11/29/2015. Contraception: OCP Menopausal hormone therapy: None        OB History    No data available         Patient Active Problem List   Diagnosis Date Noted  . 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 Hirsutism 06/21/2015    History reviewed. No pertinent past medical history.  Past Surgical History  Procedure Laterality Date  . Wisdom tooth extraction      Current Outpatient Prescriptions  Medication Sig Dispense Refill  . drospirenone-ethinyl estradiol (YAZ,GIANVI,LORYNA) 3-0.02 MG tablet Take 1 tablet by mouth daily. 3 Package 2  . etonogestrel (NEXPLANON) 68 MG IMPL implant 1 each (68 mg total) by Subdermal route once. 1 each 0   No current facility-administered medications for this visit.     ALLERGIES: Penicillins  Family History  Problem Relation Age of Onset  . Thyroid disease Mother   . Thyroid disease Father   . Colon polyps Father   . Diabetes Paternal Aunt   . Diabetes Maternal Grandfather     Social History   Social History  . Marital Status: Single    Spouse Name: N/A  . Number of Children: N/A  . Years of Education: N/A   Occupational History  . Not on file.   Social History Main Topics  . Smoking status: Never Smoker   . Smokeless tobacco: Never Used  . Alcohol Use: No  . Drug Use: No  . Sexual Activity: No   Other Topics Concern  . Not on file   Social History Narrative    Review of Systems  All other systems reviewed and are negative.   PHYSICAL EXAMINATION:    BP 122/60 mmHg  Pulse 72  Resp 16  Ht 5\' 5"  (1.651 m)  Wt 155 lb (70.308 kg)  BMI 25.79 kg/m2  LMP 11/29/2015    General appearance: alert, cooperative  and appears stated age  Risks of nexplanon insertion were reviewed with the patient and a consent was signed.  The patient was placed in the supine position with her left arm bent at the elbow. The area was cleansed with betadine and injected with 1% lidocaine. The nexplanon device was inserted in the usual fashion without difficulty. Slight oozing from the insertion site was stopped with pressure. The device was palpated in place.  The patients arm was cleansed of betadine and a steri strip was placed over the incision. A gauze was wrapped around her arm.  She tolerated the procedure well  Instructions for care were discussed.     ASSESSMENT Here for nexplanon insertion Screening for STD    PLAN Send urine for GC/CT STD testing UPT negative Nexplanon insertion   An After Visit Summary was printed and given to the patient.

## 2015-12-01 LAB — STD PANEL
HIV 1&2 Ab, 4th Generation: NONREACTIVE
Hepatitis B Surface Ag: NEGATIVE

## 2015-12-01 LAB — GC/CHLAMYDIA PROBE AMP, URINE
Chlamydia, Swab/Urine, PCR: NOT DETECTED
GC Probe Amp, Urine: NOT DETECTED

## 2016-04-07 ENCOUNTER — Telehealth: Payer: Self-pay | Admitting: Obstetrics and Gynecology

## 2016-04-07 NOTE — Telephone Encounter (Signed)
Patient thinks she may be having some side effects from nexplanon. She will be at work from 9-5 but will try to return a call.

## 2016-04-07 NOTE — Telephone Encounter (Signed)
Left message to call Treyven Lafauci at [phone]. 

## 2016-04-08 ENCOUNTER — Encounter: Payer: Self-pay | Admitting: Obstetrics and Gynecology

## 2016-04-08 ENCOUNTER — Ambulatory Visit (INDEPENDENT_AMBULATORY_CARE_PROVIDER_SITE_OTHER): Payer: 59 | Admitting: Obstetrics and Gynecology

## 2016-04-08 VITALS — BP 102/70 | HR 68 | Resp 14 | Wt 162.0 lb

## 2016-04-08 DIAGNOSIS — R5383 Other fatigue: Secondary | ICD-10-CM | POA: Diagnosis not present

## 2016-04-08 DIAGNOSIS — L659 Nonscarring hair loss, unspecified: Secondary | ICD-10-CM | POA: Diagnosis not present

## 2016-04-08 DIAGNOSIS — N921 Excessive and frequent menstruation with irregular cycle: Secondary | ICD-10-CM | POA: Diagnosis not present

## 2016-04-08 DIAGNOSIS — Z975 Presence of (intrauterine) contraceptive device: Secondary | ICD-10-CM | POA: Diagnosis not present

## 2016-04-08 LAB — CBC
HEMATOCRIT: 35 % (ref 35.0–45.0)
HEMOGLOBIN: 11.2 g/dL — AB (ref 11.7–15.5)
MCH: 24.7 pg — ABNORMAL LOW (ref 27.0–33.0)
MCHC: 32 g/dL (ref 32.0–36.0)
MCV: 77.3 fL — ABNORMAL LOW (ref 80.0–100.0)
MPV: 9.7 fL (ref 7.5–12.5)
Platelets: 285 10*3/uL (ref 140–400)
RBC: 4.53 MIL/uL (ref 3.80–5.10)
RDW: 15.3 % — AB (ref 11.0–15.0)
WBC: 5.1 10*3/uL (ref 3.8–10.8)

## 2016-04-08 MED ORDER — DROSPIRENONE-ETHINYL ESTRADIOL 3-0.02 MG PO TABS: 1.0000 | ORAL_TABLET | Freq: Every day | ORAL | Status: DC

## 2016-04-08 NOTE — Progress Notes (Signed)
Patient ID: Kathryn FairySara Jenkins, female   DOB: 03/24/1996, 20 y.o.   MRN: [id number] GYNECOLOGY  VISIT   HPI: 20 y.o.   Single  Caucasian  female   No obstetric history on file. with Patient's last menstrual period was 01/28/2016.   here c/o irregular bleeding and hair loss. She has been bleeding since early March, every day, light. Can wear a regular tampon for 12 hours.  She c/o thinning hair in the last few weeks, a lot of hair is falling out every day. Some fatigue, but related to her life. Some increased stress for the last 6 months, her GM died, boyfriend had to move back to [place], cat was put down. She may be depressed. She has one more semester of school. Feeling a little better since finals are done. Going to [place] at the end of the summer.  She has had weight gain, but hasn't been eating well or exercising. In the last few days she has already dropped a couple of pounds, eating better.   GYNECOLOGIC HISTORY: Patient's last menstrual period was 01/28/2016. Contraception:Nexplanon  Menopausal hormone therapy: none         OB History    No data available         Patient Active Problem List   Diagnosis Date Noted  . 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 Hirsutism 06/21/2015    History reviewed. No pertinent past medical history.  Past Surgical History  Procedure Laterality Date  . Wisdom tooth extraction      Current Outpatient Prescriptions  Medication Sig Dispense Refill  . etonogestrel (NEXPLANON) 68 MG IMPL implant 1 each (68 mg total) by Subdermal route once. 1 each 0   No current facility-administered medications for this visit.     ALLERGIES: Penicillins  Family History  Problem Relation Age of Onset  . Thyroid disease Mother   . Thyroid disease Father   . Colon polyps Father   . Diabetes Paternal Aunt   . Diabetes Maternal Grandfather   . Cancer Maternal Grandmother     Social History   Social History  . Marital Status: Single    Spouse Name: N/A  . Number of  Children: N/A  . Years of Education: N/A   Occupational History  . Not on file.   Social History Main Topics  . Smoking status: Never Smoker   . Smokeless tobacco: Never Used  . Alcohol Use: No  . Drug Use: No  . Sexual Activity: No   Other Topics Concern  . Not on file   Social History Narrative    Review of Systems  Constitutional:       Weight gain   HENT: Negative.   Eyes: Negative.   Respiratory: Negative.   Cardiovascular: Negative.   Gastrointestinal: Negative.   Genitourinary:       Irregular menstrual bleeding   Skin: Negative.   Neurological: Negative.   Endo/Heme/Allergies: Negative.        Hair loss    PHYSICAL EXAMINATION:    BP 102/70 mmHg  Pulse 68  Resp 14  Wt 162 lb (73.483 kg)  LMP 01/28/2016    General appearance: alert, cooperative and appears stated age  ASSESSMENT Hair loss Bleeding on the nexplanon Fatigue    PLAN TSH, CBC Will treat with OCP's for the next 2-3 months Call with any concerns   An After Visit Summary was printed and given to the patient.

## 2016-04-08 NOTE — Telephone Encounter (Signed)
Spoke with patient. Patient states that since having her nexplanon inserted on 11/30/2015 she has been experiencing increased hair loss. Also reports she has been having light bleeding since March. Denies any heavy bleeding. States she is wearing a regular tampon daily and having to change it every 5 hours. Patient is concerned about bleeding and hair loss she is experiencing. Advised she will need to be seen in the office for further evaluation. She is agreeable. Appointment scheduled to today 04/08/2016 at 3:15 pm with Dr.Jertson. She is agreeable to date and time.  Routing to provider for final review. Patient agreeable to disposition. Will close encounter.

## 2016-04-09 LAB — TSH: TSH: 1.29 m[IU]/L

## 2016-05-08 ENCOUNTER — Ambulatory Visit (INDEPENDENT_AMBULATORY_CARE_PROVIDER_SITE_OTHER): Payer: 59 | Admitting: Licensed Clinical Social Worker

## 2016-05-08 DIAGNOSIS — F321 Major depressive disorder, single episode, moderate: Secondary | ICD-10-CM | POA: Diagnosis not present

## 2016-05-23 ENCOUNTER — Ambulatory Visit (INDEPENDENT_AMBULATORY_CARE_PROVIDER_SITE_OTHER): Payer: 59 | Admitting: Licensed Clinical Social Worker

## 2016-05-23 DIAGNOSIS — F321 Major depressive disorder, single episode, moderate: Secondary | ICD-10-CM

## 2016-05-29 ENCOUNTER — Ambulatory Visit (INDEPENDENT_AMBULATORY_CARE_PROVIDER_SITE_OTHER): Payer: 59 | Admitting: Licensed Clinical Social Worker

## 2016-05-29 DIAGNOSIS — F321 Major depressive disorder, single episode, moderate: Secondary | ICD-10-CM | POA: Diagnosis not present

## 2016-06-09 ENCOUNTER — Ambulatory Visit (INDEPENDENT_AMBULATORY_CARE_PROVIDER_SITE_OTHER): Payer: 59 | Admitting: Licensed Clinical Social Worker

## 2016-06-09 DIAGNOSIS — F324 Major depressive disorder, single episode, in partial remission: Secondary | ICD-10-CM

## 2016-06-11 ENCOUNTER — Other Ambulatory Visit: Payer: Self-pay

## 2016-06-13 ENCOUNTER — Other Ambulatory Visit: Payer: Self-pay | Admitting: Obstetrics and Gynecology

## 2016-06-13 ENCOUNTER — Other Ambulatory Visit: Payer: 59

## 2016-06-13 DIAGNOSIS — N921 Excessive and frequent menstruation with irregular cycle: Secondary | ICD-10-CM

## 2016-06-13 DIAGNOSIS — Z975 Presence of (intrauterine) contraceptive device: Secondary | ICD-10-CM

## 2016-06-13 DIAGNOSIS — R5383 Other fatigue: Secondary | ICD-10-CM

## 2016-06-13 LAB — FERRITIN: Ferritin: 16 ng/mL (ref 10–154)

## 2016-06-13 LAB — CBC
HCT: 41.3 % (ref 35.0–45.0)
Hemoglobin: 13.7 g/dL (ref 11.7–15.5)
MCH: 27.7 pg (ref 27.0–33.0)
MCHC: 33.2 g/dL (ref 32.0–36.0)
MCV: 83.4 fL (ref 80.0–100.0)
MPV: 10 fL (ref 7.5–12.5)
Platelets: 239 10*3/uL (ref 140–400)
RBC: 4.95 MIL/uL (ref 3.80–5.10)
RDW: 16.8 % — AB (ref 11.0–15.0)
WBC: 4.6 10*3/uL (ref 3.8–10.8)

## 2016-06-13 NOTE — Addendum Note (Signed)
Addended by: Luisa DagoPHILLIPS, Kadia Abaya C on: 06/13/2016 02:22 PM   Modules accepted: Orders

## 2016-07-07 ENCOUNTER — Ambulatory Visit (INDEPENDENT_AMBULATORY_CARE_PROVIDER_SITE_OTHER): Payer: 59 | Admitting: Licensed Clinical Social Worker

## 2016-07-07 DIAGNOSIS — F324 Major depressive disorder, single episode, in partial remission: Secondary | ICD-10-CM | POA: Diagnosis not present

## 2016-11-27 ENCOUNTER — Encounter: Payer: Self-pay | Admitting: Obstetrics and Gynecology

## 2016-11-27 ENCOUNTER — Ambulatory Visit (INDEPENDENT_AMBULATORY_CARE_PROVIDER_SITE_OTHER): Payer: 59 | Admitting: Obstetrics and Gynecology

## 2016-11-27 ENCOUNTER — Telehealth: Payer: Self-pay | Admitting: Obstetrics and Gynecology

## 2016-11-27 VITALS — BP 118/60 | HR 80 | Resp 14 | Ht 68.25 in | Wt 170.0 lb

## 2016-11-27 DIAGNOSIS — Z3046 Encounter for surveillance of implantable subdermal contraceptive: Secondary | ICD-10-CM

## 2016-11-27 DIAGNOSIS — Z113 Encounter for screening for infections with a predominantly sexual mode of transmission: Secondary | ICD-10-CM | POA: Diagnosis not present

## 2016-11-27 DIAGNOSIS — Z0000 Encounter for general adult medical examination without abnormal findings: Secondary | ICD-10-CM

## 2016-11-27 DIAGNOSIS — N898 Other specified noninflammatory disorders of vagina: Secondary | ICD-10-CM

## 2016-11-27 DIAGNOSIS — Z23 Encounter for immunization: Secondary | ICD-10-CM

## 2016-11-27 DIAGNOSIS — R635 Abnormal weight gain: Secondary | ICD-10-CM

## 2016-11-27 DIAGNOSIS — Z862 Personal history of diseases of the blood and blood-forming organs and certain disorders involving the immune mechanism: Secondary | ICD-10-CM | POA: Diagnosis not present

## 2016-11-27 DIAGNOSIS — Z124 Encounter for screening for malignant neoplasm of cervix: Secondary | ICD-10-CM

## 2016-11-27 DIAGNOSIS — Z01419 Encounter for gynecological examination (general) (routine) without abnormal findings: Secondary | ICD-10-CM

## 2016-11-27 LAB — COMPREHENSIVE METABOLIC PANEL
ALT: 11 U/L (ref 6–29)
AST: 15 U/L (ref 10–30)
Albumin: 3.9 g/dL (ref 3.6–5.1)
Alkaline Phosphatase: 54 U/L (ref 33–115)
BUN: 14 mg/dL (ref 7–25)
CHLORIDE: 104 mmol/L (ref 98–110)
CO2: 23 mmol/L (ref 20–31)
Calcium: 9.2 mg/dL (ref 8.6–10.2)
Creat: 0.81 mg/dL (ref 0.50–1.10)
GLUCOSE: 89 mg/dL (ref 65–99)
Potassium: 4.5 mmol/L (ref 3.5–5.3)
SODIUM: 138 mmol/L (ref 135–146)
Total Bilirubin: 0.6 mg/dL (ref 0.2–1.2)
Total Protein: 6.7 g/dL (ref 6.1–8.1)

## 2016-11-27 LAB — CBC
HCT: 43.6 % (ref 35.0–45.0)
Hemoglobin: 14.5 g/dL (ref 11.7–15.5)
MCH: 29.1 pg (ref 27.0–33.0)
MCHC: 33.3 g/dL (ref 32.0–36.0)
MCV: 87.4 fL (ref 80.0–100.0)
MPV: 10.3 fL (ref 7.5–12.5)
PLATELETS: 323 10*3/uL (ref 140–400)
RBC: 4.99 MIL/uL (ref 3.80–5.10)
RDW: 12.5 % (ref 11.0–15.0)
WBC: 6.7 10*3/uL (ref 3.8–10.8)

## 2016-11-27 LAB — LIPID PANEL
CHOL/HDL RATIO: 2.3 ratio (ref <5.0)
Cholesterol: 152 mg/dL (ref <200)
HDL: 66 mg/dL (ref >50)
LDL CALC: 64 mg/dL (ref <100)
Triglycerides: 112 mg/dL (ref <150)
VLDL: 22 mg/dL (ref <30)

## 2016-11-27 LAB — TSH: TSH: 1.96 m[IU]/L

## 2016-11-27 LAB — FERRITIN: Ferritin: 51 ng/mL (ref 10–154)

## 2016-11-27 MED ORDER — ORTHO TRI-CYCLEN (28) 0.18/0.215/0.25 MG-35 MCG PO TABS: 1.0000 | ORAL_TABLET | Freq: Every day | ORAL | 3 refills | Status: DC

## 2016-11-27 NOTE — Patient Instructions (Signed)

## 2016-11-27 NOTE — Telephone Encounter (Signed)
Patient is scheduled for f/u for nexplanon removal.

## 2016-11-27 NOTE — Progress Notes (Signed)
21 y.o. G0P0000 SingleCaucasianF here for annual exam. She has a nexplanon, inserted in 1/17.  She c/o intermittent headaches, most recently with her cycle. Occur 1-2 x a month. Just started in the summer.  She has been sexually, not since March.  Initially no menses x 3 months, then was spotting all the time. She went on OCP's x 3 months starting in May, went off. She started bleeding again and is on her 3rd month of pills again.  She has gained 15 lbs in the last year. Knees also hurting so not working out as much. Having trouble controlling her appetite.  She c/o a band at the opening of her vagina, needs to move it out of the way with tampons. Had some pain with intercourse, depending on her position.  Period Duration (Days): 4-7 days  Period Pattern: (!) Irregular Menstrual Flow: Moderate Menstrual Control: Tampon, Maxi pad, Thin pad Dysmenorrhea: (!) Mild Dysmenorrhea Symptoms: Other (Comment)lower back pain  Patient's last menstrual period was 11/20/2016.          Sexually active: No.  The current method of family planning is OCP/ Nexplanon  (estrogen/progesterone).    Exercising: No.  The patient does not participate in regular exercise at present. Smoker:  no  Health Maintenance: Pap:  Never TDaP:  2007 Gardasil: completed all 3    reports that she has never smoked. She has never used smokeless tobacco. She reports that she does not drink alcohol or use drugs. She is a Holiday representativesenior in college, going to [employer] school (2.5 years), already accepted to one school, waiting on another.   No past medical history on file.  Past Surgical History:  Procedure Laterality Date  . WISDOM TOOTH EXTRACTION      Current Outpatient Prescriptions  Medication Sig Dispense Refill  . etonogestrel (NEXPLANON) 68 MG IMPL implant 1 each (68 mg total) by Subdermal route once. 1 each 0  . ORTHO TRI-CYCLEN, 28, 0.18/0.215/0.25 MG-35 MCG tablet      No current facility-administered medications for this  visit.     Family History  Problem Relation Age of Onset  . Thyroid disease Mother   . Thyroid disease Father   . Colon polyps Father   . Diabetes Paternal Aunt   . Diabetes Maternal Grandfather   . Cancer Maternal Grandmother     Review of Systems  Constitutional: Positive for unexpected weight change.       Weight gain   HENT: Negative.   Eyes: Negative.   Respiratory: Negative.   Cardiovascular: Negative.   Gastrointestinal: Negative.   Endocrine: Negative.   Genitourinary: Negative.        Irregular menstrual bleeding  Musculoskeletal: Negative.   Skin: Negative.   Allergic/Immunologic: Negative.   Neurological: Positive for headaches.  Psychiatric/Behavioral: Negative.     Exam:   BP 118/60 (BP Location: Right Arm, Patient Position: Sitting, Cuff Size: Normal)   Pulse 80   Resp 14   Ht 5' 8.25" (1.734 m)   Wt 170 lb (77.1 kg)   LMP 11/20/2016   BMI 25.66 kg/m   Weight change: @WEIGHTCHANGE @ Height:   Height: 5' 8.25" (173.4 cm)  Ht Readings from Last 3 Encounters:  11/27/16 5' 8.25" (1.734 m)  11/30/15 5\' 5"  (1.651 m)  06/21/15 5\' 5"  (1.651 m)    General appearance: alert, cooperative and appears stated age Head: Normocephalic, without obvious abnormality, atraumatic Neck: no adenopathy, supple, symmetrical, trachea midline and thyroid normal to inspection and palpation Lungs: clear to auscultation  bilaterally Cardiovascular: regular rate and rhythm Breasts: normal appearance, no masses or tenderness Heart: regular rate and rhythm Abdomen: soft, non-tender; bowel sounds normal; no masses,  no organomegaly Extremities: extremities normal, atraumatic, no cyanosis or edema Skin: Skin color, texture, turgor normal. No rashes or lesions Lymph nodes: Cervical, supraclavicular, and axillary nodes normal. No abnormal inguinal nodes palpated Neurologic: Grossly normal   Pelvic: External genitalia:  no lesions. 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  A:  Well Woman with normal exam  Contraception, bleeding and weight gain with the nexplanon  Hymenal remnant, gets caught with tampons, uncomfortable with sex    P:   Pap  Screening STD  Screening labs, ferritin and TSH  Wants to continue on her current pills  Return for nexplanon removal  Discussed breast self awareness  Discussed calcium and vit D intake  Continue OCP's  Condoms if sexually active  Discussed the option of taking down the hymenal remnant her or in the or, she will consider

## 2016-11-28 LAB — STD PANEL
HIV 1&2 Ab, 4th Generation: NONREACTIVE
Hepatitis B Surface Ag: NEGATIVE

## 2016-11-28 LAB — IPS N GONORRHOEA AND CHLAMYDIA BY PCR

## 2016-11-28 LAB — VITAMIN D 25 HYDROXY (VIT D DEFICIENCY, FRACTURES): VIT D 25 HYDROXY: 34 ng/mL (ref 30–100)

## 2016-11-28 LAB — HEPATITIS C ANTIBODY: HCV AB: NEGATIVE

## 2016-12-01 LAB — IPS PAP SMEAR ONLY

## 2016-12-01 NOTE — Telephone Encounter (Signed)
Call to patient to review benefit for nexplanon removal. Per most recent DPR, left details on voicemail. Details included benefit information, appointment date and time and cancellation policy. Instructions left to call with questions.

## 2016-12-10 ENCOUNTER — Ambulatory Visit: Payer: 59 | Admitting: Obstetrics and Gynecology

## 2016-12-11 ENCOUNTER — Ambulatory Visit: Payer: 59 | Admitting: Obstetrics and Gynecology

## 2016-12-16 ENCOUNTER — Encounter: Payer: Self-pay | Admitting: Obstetrics and Gynecology

## 2016-12-16 ENCOUNTER — Ambulatory Visit (INDEPENDENT_AMBULATORY_CARE_PROVIDER_SITE_OTHER): Payer: 59 | Admitting: Obstetrics and Gynecology

## 2016-12-16 DIAGNOSIS — Z3046 Encounter for surveillance of implantable subdermal contraceptive: Secondary | ICD-10-CM

## 2016-12-16 NOTE — Progress Notes (Signed)
GYNECOLOGY  VISIT   HPI: 21 y.o.   Single  Caucasian  female   G0P0000 with Patient's last menstrual period was 12/16/2016.   here for   Nexplanon removal. She didn't like the side effects, she is already on OCP's.  She has a hymenal remnant and would like it removed in the office.   GYNECOLOGIC HISTORY: Patient's last menstrual period was 12/16/2016. Contraception:Nexplanon/Ortho Tri-Cyclen Menopausal hormone therapy: n/a        OB History    Gravida Para Term Preterm AB Living   0 0 0 0 0 0   SAB TAB Ectopic Multiple Live Births   0 0 0 0 0         Patient Active Problem List   Diagnosis Date Noted  . 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 Hirsutism 06/21/2015    History reviewed. No pertinent past medical history.  Past Surgical History:  Procedure Laterality Date  . WISDOM TOOTH EXTRACTION      Current Outpatient Prescriptions  Medication Sig Dispense Refill  . etonogestrel (NEXPLANON) 68 MG IMPL implant 1 each (68 mg total) by Subdermal route once. 1 each 0  . ORTHO TRI-CYCLEN, 28, 0.18/0.215/0.25 MG-35 MCG tablet Take 1 tablet by mouth daily. 3 Package 3   No current facility-administered medications for this visit.      ALLERGIES: Penicillins  Family History  Problem Relation Age of Onset  . Thyroid disease Mother   . Thyroid disease Father   . Colon polyps Father   . Diabetes Paternal Aunt   . Diabetes Maternal Grandfather   . Cancer Maternal Grandmother     Social History   Social History  . Marital status: Single    Spouse name: N/A  . Number of children: N/A  . Years of education: N/A   Occupational History  . Not on file.   Social History Main Topics  . Smoking status: Never Smoker  . Smokeless tobacco: Never Used  . Alcohol use No  . Drug use: No  . Sexual activity: No   Other Topics Concern  . Not on file   Social History Narrative  . No narrative on file    Review of Systems  Constitutional: Negative.   HENT: Negative.   Eyes:  Negative.   Respiratory: Negative.   Cardiovascular: Negative.   Gastrointestinal: Negative.   Genitourinary: Negative.   Musculoskeletal: Negative.   Skin: Negative.   Neurological: Negative.   Endo/Heme/Allergies: Negative.   Psychiatric/Behavioral: Negative.     PHYSICAL EXAMINATION:    BP 118/70 (BP Location: Right Arm, Patient Position: Sitting, Cuff Size: Normal)   Pulse 72   Resp 16   Wt 173 lb (78.5 kg)   LMP 12/16/2016   BMI 26.11 kg/m     General appearance: alert, cooperative and appears stated age  Risks of nexplanon removal were reviewed with the patient and a consent was signed.  The patient was placed in the supine position with her left arm bent at the elbow. The area was cleansed with betadine and injected with 1% lidocaine. A #11 blade was used to incise over the distal end of the nexplanon implant. The implant was grasped with a clamp and removed.   The patients arm was cleansed of betadine and a steri strip was placed over the incision. A gauze was wrapped around her arm.  She tolerated the procedure well  Instructions for care were discussed.    Chaperone was present for exam.  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

## 2016-12-19 ENCOUNTER — Telehealth: Payer: Self-pay | Admitting: Obstetrics and Gynecology

## 2016-12-19 NOTE — Telephone Encounter (Signed)
She can take over the counter Meclizine 25 - 50 mg po tid prn dizziness form vertigo.  I don't anticipate the vertigo is from the Nexplanon removal.  Needs to be seen if has any heavy bleeding or extended redness from the Nexplanon removal site or fever.  Cc - Dr. Oscar LaJertson

## 2016-12-19 NOTE — Telephone Encounter (Signed)
Patient called states she had Nexplanon removed on Tuesday and her steristrip came off on Wednesday.  Says she has been keeping it covered bandaid.  Noticed this morning that the site is opening up.  Says she woke up this morning experiencing Vertigo and a little redness at the incision site and some blood on the bandage but it is not currently bleeding.  Thinks the redness is coming from the tape.

## 2016-12-19 NOTE — Telephone Encounter (Signed)
Spoke with patient. Patient states nexplanon removed 1/23. Patient reports keeping bandage on for 24 hours. Patient states steri strip came off when bandage removed 1/24. Patient states she covered with band-aid as the incision was slowly opening back up. Patient reports scant amount of blood on band-aid this morning and arm is sore. Patient states no redness at incision, but redness where band-aid was in place. Patient denies any drainage with odor. Patient states arm is still sore and bruised. Patient states she did wake up with vertigo this morning. Patient states she has had vertigo in past, but not sure if related. Advised patient to continue to monitor site. Should redness or swelling at incision develop, pain or fever -return call to office or if after hours seek care at urgent care/ER. Advised patient may place another steri strip over incision. Advised patient may experience soreness after nexplanon removal. Keep area clean and dry. Advised Dr. Oscar LaJertson is out of the office today, will review with covering provider and return call with any additional recommendations. 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   Dr. Edward JollySilva any additional recommendations?  Cc: Dr. Oscar LaJertson

## 2016-12-19 NOTE — Telephone Encounter (Signed)
Call to patient, left detailed message as seen below per Dr. Edward JollySilva, ok per current dpr. Advised patient to return call to office at [phone] for any additional questions.   Routing to provider for final review. Patient is agreeable to disposition. Will close encounter.

## 2016-12-19 NOTE — Telephone Encounter (Signed)
Patient had nexplanon removed 12/16/16. Ok to close encounter

## 2016-12-31 ENCOUNTER — Ambulatory Visit (INDEPENDENT_AMBULATORY_CARE_PROVIDER_SITE_OTHER): Payer: 59 | Admitting: Obstetrics and Gynecology

## 2016-12-31 VITALS — BP 120/80 | HR 76 | Resp 16 | Ht 68.25 in | Wt 172.4 lb

## 2016-12-31 DIAGNOSIS — N898 Other specified noninflammatory disorders of vagina: Secondary | ICD-10-CM

## 2016-12-31 NOTE — Progress Notes (Signed)
GYNECOLOGY  VISIT   HPI: 21 y.o.   Single  Caucasian  female   G0P0000 with Patient's last menstrual period was 12/18/2016.   here for removal of hymenal remnant.     GYNECOLOGIC HISTORY: Patient's last menstrual period was 12/18/2016. Contraception: OCP Menopausal hormone therapy: none        OB History    Gravida Para Term Preterm AB Living   0 0 0 0 0 0   SAB TAB Ectopic Multiple Live Births   0 0 0 0 0         Patient Active Problem List   Diagnosis Date Noted  . 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 Hirsutism 06/21/2015    No past medical history on file.  Past Surgical History:  Procedure Laterality Date  . WISDOM TOOTH EXTRACTION      Current Outpatient Prescriptions  Medication Sig Dispense Refill  . ORTHO TRI-CYCLEN, 28, 0.18/0.215/0.25 MG-35 MCG tablet Take 1 tablet by mouth daily. 3 Package 3   No current facility-administered medications for this visit.      ALLERGIES: Penicillins  Family History  Problem Relation Age of Onset  . Thyroid disease Mother   . Thyroid disease Father   . Colon polyps Father   . Diabetes Paternal Aunt   . Diabetes Maternal Grandfather   . Cancer Maternal Grandmother     Social History   Social History  . Marital status: Single    Spouse name: N/A  . Number of children: N/A  . Years of education: N/A   Occupational History  . Not on file.   Social History Main Topics  . Smoking status: Never Smoker  . Smokeless tobacco: Never Used  . Alcohol use No  . Drug use: No  . Sexual activity: No   Other Topics Concern  . Not on file   Social History Narrative  . No narrative on file    Review of Systems  Constitutional: Negative.   HENT: Negative.   Eyes: Negative.   Respiratory: Negative.   Cardiovascular: Negative.   Gastrointestinal: Negative.   Genitourinary: Negative.   Musculoskeletal: Negative.   Skin: Negative.   Neurological: Negative.   Endo/Heme/Allergies: Negative.   Psychiatric/Behavioral:  Negative.     PHYSICAL EXAMINATION:    BP 120/80 (BP Location: Right Arm, Patient Position: Sitting, Cuff Size: Normal)   Pulse 76   Resp 16   Ht 5' 8.25" (1.734 m)   Wt 172 lb 6.4 oz (78.2 kg)   LMP 12/18/2016   BMI 26.02 kg/m     General appearance: alert, cooperative and appears stated age  Pelvic: External genitalia:  no lesions, she has a hymenal remnant between 12 and 6 o'clock, approximately 5 mm thick and several centimeters long              Urethra:  normal appearing urethra with no masses, tenderness or lesions              Bartholins and Skenes: normal     Procedure: the hymenal remnant was placed on stretch, cleansed with betadine and injected with 2% lidocaine with epinephrine.                Chaperone was present for exam.  ASSESSMENT Hymenal remnant    PLAN Partial hymenectomy, revision of hymenal ring   An After Visit Summary was printed and given to the patient.

## 2017-03-06 NOTE — Telephone Encounter (Signed)
Patient has not called back to schedule Nexplanon Insertion, Ok to close encounter?   Routing to provider for final review.

## 2017-03-06 NOTE — Telephone Encounter (Signed)
Encounter has been closed

## 2017-11-06 ENCOUNTER — Other Ambulatory Visit: Payer: Self-pay | Admitting: Obstetrics and Gynecology

## 2017-11-06 NOTE — Telephone Encounter (Signed)
Medication refill request: OCP  Last AEX:  11-27-16  Next AEX: left message to call and schedule AEX  Last MMG (if hormonal medication request): N/A Refill authorized: please advise

## 2017-11-27 ENCOUNTER — Other Ambulatory Visit: Payer: Self-pay

## 2017-11-27 NOTE — Telephone Encounter (Signed)
Refill request received from the pharmacy for patient's OCP. Patient is due for an AEX. Tried calling patient, no answer, Message left for patient to return my call. Please advise on refill.  Medication refill request: OCP Last AEX:  11/27/16 JJ Next AEX: none scheduled Last MMG (if hormonal medication request): n/a Refill authorized: 11/06/17 #28 w/0 refills; Please advise, Dr. Oscar LaJertson out of office.

## 2017-11-30 MED ORDER — ORTHO TRI-CYCLEN (28) 0.18/0.215/0.25 MG-35 MCG PO TABS: 1.0000 | ORAL_TABLET | Freq: Every day | ORAL | 0 refills | Status: DC

## 2017-11-30 NOTE — Telephone Encounter (Signed)
RX approved for one month.  She does need AEX.  Thanks.

## 2017-11-30 NOTE — Telephone Encounter (Signed)
Patient scheduled for 12/14/16

## 2017-12-14 ENCOUNTER — Ambulatory Visit (INDEPENDENT_AMBULATORY_CARE_PROVIDER_SITE_OTHER): Payer: 59 | Admitting: Obstetrics and Gynecology

## 2017-12-14 ENCOUNTER — Other Ambulatory Visit: Payer: Self-pay

## 2017-12-14 ENCOUNTER — Encounter: Payer: Self-pay | Admitting: Obstetrics and Gynecology

## 2017-12-14 VITALS — BP 138/80 | HR 80 | Resp 16 | Ht 65.25 in | Wt 184.0 lb

## 2017-12-14 DIAGNOSIS — Z0000 Encounter for general adult medical examination without abnormal findings: Secondary | ICD-10-CM | POA: Diagnosis not present

## 2017-12-14 DIAGNOSIS — Z01419 Encounter for gynecological examination (general) (routine) without abnormal findings: Secondary | ICD-10-CM

## 2017-12-14 MED ORDER — ORTHO TRI-CYCLEN (28) 0.18/0.215/0.25 MG-35 MCG PO TABS: 1.0000 | ORAL_TABLET | Freq: Every day | ORAL | 3 refills | Status: DC

## 2017-12-14 NOTE — Progress Notes (Addendum)
22 y.o. G0P0000 SingleCaucasianF here for annual exam.   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 Currently better.  1+ weeks ago she had sharp pain in the RLQ, lasted a day. Was questioning ovulation, but she is on OCP's, no late or missed pills. Not sexually active in the last year.  Period Cycle (Days): 28 Period Duration (Days): 5-7 days  Period Pattern: Regular Menstrual Flow: Moderate Menstrual Control: Thin pad, Tampon Menstrual Control Change Freq (Hours): changes tampon/pad every 3-6 hours  Dysmenorrhea: (!) Moderate Dysmenorrhea Symptoms: Cramping  Patient's last menstrual period was 11/23/2017.          Sexually active: No.  The current method of family planning is OCP (estrogen/progesterone).    Exercising: Yes.    weights/ cardio/ walking  Smoker:  no  Health Maintenance: Pap:  11-27-16 WNL  History of abnormal Pap:  no TDaP:  11-27-16 Gardasil: completed all 3    reports that  has never smoked. she has never used smokeless tobacco. She reports that she drinks about 1.2 - 2.4 oz of alcohol per week. She reports that she does not use drugs. 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 Graduates in 12/20.  History reviewed. No pertinent past medical history.  Past Surgical History:  Procedure Laterality Date  . WISDOM TOOTH EXTRACTION      Current Outpatient Medications  Medication Sig Dispense Refill  . cholecalciferol (VITAMIN D) 1000 units tablet Take 1,000 Units by mouth daily.    . ORTHO TRI-CYCLEN, 28, 0.18/0.215/0.25 MG-35 MCG tablet Take 1 tablet by mouth daily. 28 tablet 0   No current facility-administered medications for this visit.     Family History  Problem Relation Age of Onset  . Thyroid disease Mother   . Thyroid disease Father   . Colon polyps Father   . Diabetes Paternal Aunt   . Diabetes Maternal Grandfather   . Cancer Maternal Grandmother     Review of Systems  Constitutional:  Negative.   HENT: Negative.   Eyes: Negative.   Respiratory: Negative.   Cardiovascular: Negative.   Gastrointestinal: Negative.   Endocrine: Negative.   Genitourinary: Negative.        Dysmenorrhea   Musculoskeletal:       Right leg swelling   Skin: Negative.   Allergic/Immunologic: Negative.   Neurological: Negative.   Psychiatric/Behavioral: Negative.   Patient has had mild BL extremity edema, slightly worse on the right. Notices after she has been sitting in class during the day. No pain in her calf.   Exam:   BP 138/80 (BP Location: Right Arm, Patient Position: Sitting, Cuff Size: Normal)   Pulse 80   Resp 16   Ht 5' 5.25" (1.657 m)   Wt 184 lb (83.5 kg)   LMP 11/23/2017   BMI 30.39 kg/m   Weight change: @WEIGHTCHANGE @ Height:   Height: 5' 5.25" (165.7 cm)  Ht Readings from Last 3 Encounters:  12/14/17 5' 5.25" (1.657 m)  12/31/16 5' 8.25" (1.734 m)  11/27/16 5' 8.25" (1.734 m)    General appearance: alert, cooperative and appears stated age Head: Normocephalic, without obvious abnormality, atraumatic Neck: no adenopathy, supple, symmetrical, trachea midline and thyroid normal to inspection and palpation Lungs: clear to auscultation bilaterally Cardiovascular: regular rate and rhythm Breasts: normal appearance, no masses or tenderness Abdomen: soft, non-tender; non distended,  no masses,  no organomegaly Extremities: extremities normal, atraumatic, no cyanosis or edema  Skin: Skin color, texture, turgor normal. No rashes or lesions Lymph nodes: Cervical, supraclavicular, and axillary nodes normal. No abnormal inguinal nodes palpated Neurologic: Grossly normal   Pelvic: External genitalia:  no lesions              Urethra:  normal appearing urethra with no masses, tenderness or lesions              Bartholins and Skenes: normal                 Vagina: normal appearing vagina with normal color and discharge, no lesions              Cervix: no lesions                Bimanual Exam:  Uterus:  normal size, contour, position, consistency, mobility, non-tender              Adnexa: no mass, fullness, tenderness               Rectovaginal: Confirms               Anus:  normal sphincter tone, no lesions  Chaperone was present for exam.  A:  Well Woman with normal exam  Weight gain  She hasn't been getting sun or vit d, just started supplements  P:   CBC, CMP, lipid panel  No std testing needed, no pap needed  Continue OCP's  Discussed breast self exam  Discussed calcium and vit D intake    Addendum: BLE no calf tenderness, negative homans, trace edema

## 2017-12-14 NOTE — Patient Instructions (Signed)
EXERCISE AND DIET:  We recommended that you start or continue a regular exercise program for good health. Regular exercise means any activity that makes your heart beat faster and makes you sweat.  We recommend exercising at least 30 minutes per day at least 3 days a week, preferably 4 or 5.  We also recommend a diet low in fat and sugar.  Inactivity, poor dietary choices and obesity can cause diabetes, heart attack, stroke, and kidney damage, among others.    ALCOHOL AND SMOKING:  Women should limit their alcohol intake to no more than 7 drinks/beers/glasses of wine (combined, not each!) per week. Moderation of alcohol intake to this level decreases your risk of breast cancer and liver damage. And of course, no recreational drugs are part of a healthy lifestyle.  And absolutely no smoking or even second hand smoke. Most people know smoking can cause heart and lung diseases, but did you know it also contributes to weakening of your bones? Aging of your skin?  Yellowing of your teeth and nails?  CALCIUM AND VITAMIN D:  Adequate intake of calcium and Vitamin D are recommended.  The recommendations for exact amounts of these supplements seem to change often, but generally speaking 600 mg of calcium (either carbonate or citrate) and 800 units of Vitamin D per day seems prudent. Certain women may benefit from higher intake of Vitamin D.  If you are among these women, your doctor will have told you during your visit.    PAP SMEARS:  Pap smears, to check for cervical cancer or precancers,  have traditionally been done yearly, although recent scientific advances have shown that most women can have pap smears less often.  However, every woman still should have a physical exam from her gynecologist every year. It will include a breast check, inspection of the vulva and vagina to check for abnormal growths or skin changes, a visual exam of the cervix, and then an exam to evaluate the size and shape of the uterus and  ovaries.  And after 23 years of age, a rectal exam is indicated to check for rectal cancers. We will also provide age appropriate advice regarding health maintenance, like when you should have certain vaccines, screening for sexually transmitted diseases, bone density testing, colonoscopy, mammograms, etc.   MAMMOGRAMS:  All women over 40 years old should have a yearly mammogram. Many facilities now offer a "3D" mammogram, which may cost around $50 extra out of pocket. If possible,  we recommend you accept the option to have the 3D mammogram performed.  It both reduces the number of women who will be called back for extra views which then turn out to be normal, and it is better than the routine mammogram at detecting truly abnormal areas.    COLONOSCOPY:  Colonoscopy to screen for colon cancer is recommended for all women at age 50.  We know, you hate the idea of the prep.  We agree, BUT, having colon cancer and not knowing it is worse!!  Colon cancer so often starts as a polyp that can be seen and removed at colonscopy, which can quite literally save your life!  And if your first colonoscopy is normal and you have no family history of colon cancer, most women don't have to have it again for 10 years.  Once every ten years, you can do something that may end up saving your life, right?  We will be happy to help you get it scheduled when you are ready.    Be sure to check your insurance coverage so you understand how much it will cost.  It may be covered as a preventative service at no cost, but you should check your particular policy.      Breast Self-Awareness Breast self-awareness means being familiar with how your breasts look and feel. It involves checking your breasts regularly and reporting any changes to your health care provider. Practicing breast self-awareness is important. A change in your breasts can be a sign of a serious medical problem. Being familiar with how your breasts look and feel allows  you to find any problems early, when treatment is more likely to be successful. All women should practice breast self-awareness, including women who have had breast implants. How to do a breast self-exam One way to learn what is normal for your breasts and whether your breasts are changing is to do a breast self-exam. To do a breast self-exam: Look for Changes  1. Remove all the clothing above your waist. 2. Stand in front of a mirror in a room with good lighting. 3. Put your hands on your hips. 4. Push your hands firmly downward. 5. Compare your breasts in the mirror. Look for differences between them (asymmetry), such as: ? Differences in shape. ? Differences in size. ? Puckers, dips, and bumps in one breast and not the other. 6. Look at each breast for changes in your skin, such as: ? Redness. ? Scaly areas. 7. Look for changes in your nipples, such as: ? Discharge. ? Bleeding. ? Dimpling. ? Redness. ? A change in position. Feel for Changes  Carefully feel your breasts for lumps and changes. It is best to do this while lying on your back on the floor and again while sitting or standing in the shower or tub with soapy water on your skin. Feel each breast in the following way:  Place the arm on the side of the breast you are examining above your head.  Feel your breast with the other hand.  Start in the nipple area and make  inch (2 cm) overlapping circles to feel your breast. Use the pads of your three middle fingers to do this. Apply light pressure, then medium pressure, then firm pressure. The light pressure will allow you to feel the tissue closest to the skin. The medium pressure will allow you to feel the tissue that is a little deeper. The firm pressure will allow you to feel the tissue close to the ribs.  Continue the overlapping circles, moving downward over the breast until you feel your ribs below your breast.  Move one finger-width toward the center of the body.  Continue to use the  inch (2 cm) overlapping circles to feel your breast as you move slowly up toward your collarbone.  Continue the up and down exam using all three pressures until you reach your armpit.  Write Down What You Find  Write down what is normal for each breast and any changes that you find. Keep a written record with breast changes or normal findings for each breast. By writing this information down, you do not need to depend only on memory for size, tenderness, or location. Write down where you are in your menstrual cycle, if you are still menstruating. If you are having trouble noticing differences in your breasts, do not get discouraged. With time you will become more familiar with the variations in your breasts and more comfortable with the exam. How often should I examine my breasts? Examine   your breasts every month. If you are breastfeeding, the best time to examine your breasts is after a feeding or after using a breast pump. If you menstruate, the best time to examine your breasts is 5-7 days after your period is over. During your period, your breasts are lumpier, and it may be more difficult to notice changes. When should I see my health care provider? See your health care provider if you notice:  A change in shape or size of your breasts or nipples.  A change in the skin of your breast or nipples, such as a reddened or scaly area.  Unusual discharge from your nipples.  A lump or thick area that was not there before.  Pain in your breasts.  Anything that concerns you.  This information is not intended to replace advice given to you by your health care provider. Make sure you discuss any questions you have with your health care provider. Document Released: 11/10/2005 Document Revised: 04/17/2016 Document Reviewed: 09/30/2015 Elsevier Interactive Patient Education  2018 [employer] Inc[employer]  

## 2017-12-15 LAB — CBC
HEMATOCRIT: 41 % (ref 34.0–46.6)
Hemoglobin: 13.4 g/dL (ref 11.1–15.9)
MCH: 27.2 pg (ref 26.6–33.0)
MCHC: 32.7 g/dL (ref 31.5–35.7)
MCV: 83 fL (ref 79–97)
Platelets: 297 10*3/uL (ref 150–379)
RBC: 4.93 x10E6/uL (ref 3.77–5.28)
RDW: 14.4 % (ref 12.3–15.4)
WBC: 8.9 10*3/uL (ref 3.4–10.8)

## 2017-12-15 LAB — COMPREHENSIVE METABOLIC PANEL
ALBUMIN: 4.1 g/dL (ref 3.5–5.5)
ALT: 11 IU/L (ref 0–32)
AST: 15 IU/L (ref 0–40)
Albumin/Globulin Ratio: 1.5 (ref 1.2–2.2)
Alkaline Phosphatase: 65 IU/L (ref 39–117)
BUN / CREAT RATIO: 18 (ref 9–23)
BUN: 15 mg/dL (ref 6–20)
Bilirubin Total: <0.2 mg/dL (ref 0.0–1.2)
CALCIUM: 9.2 mg/dL (ref 8.7–10.2)
CO2: 22 mmol/L (ref 20–29)
Chloride: 103 mmol/L (ref 96–106)
Creatinine, Ser: 0.82 mg/dL (ref 0.57–1.00)
GFR, EST AFRICAN AMERICAN: 117 mL/min/{1.73_m2} (ref >59)
GFR, EST NON AFRICAN AMERICAN: 102 mL/min/{1.73_m2} (ref >59)
GLOBULIN, TOTAL: 2.8 g/dL (ref 1.5–4.5)
Glucose: 83 mg/dL (ref 65–99)
Potassium: 4.3 mmol/L (ref 3.5–5.2)
SODIUM: 139 mmol/L (ref 134–144)
TOTAL PROTEIN: 6.9 g/dL (ref 6.0–8.5)

## 2017-12-15 LAB — LIPID PANEL
CHOL/HDL RATIO: 2.4 ratio (ref 0.0–4.4)
Cholesterol, Total: 173 mg/dL (ref 100–199)
HDL: 73 mg/dL (ref >39)
LDL Calculated: 72 mg/dL (ref 0–99)
Triglycerides: 138 mg/dL (ref 0–149)
VLDL Cholesterol Cal: 28 mg/dL (ref 5–40)

## 2017-12-15 LAB — VITAMIN D 25 HYDROXY (VIT D DEFICIENCY, FRACTURES): VIT D 25 HYDROXY: 25.1 ng/mL — AB (ref 30.0–100.0)

## 2017-12-29 ENCOUNTER — Telehealth: Payer: Self-pay | Admitting: Obstetrics and Gynecology

## 2017-12-29 MED ORDER — NORGESTIM-ETH ESTRAD TRIPHASIC 0.18/0.215/0.25 MG-35 MCG PO TABS: 1.0000 | ORAL_TABLET | Freq: Every day | ORAL | 11 refills | Status: DC

## 2017-12-29 NOTE — Telephone Encounter (Signed)
Dr.Jertson this patient rx for Ortho Tri Cyclen was written as DAW. Okay to write as generic Norgestimate Ethinyl Estradiol Triphasic 28?

## 2017-12-29 NOTE — Telephone Encounter (Signed)
Patient called and requested a generic version of her birth control to save money. She said it's fine to leave any details on her voice mail.  Pharmacy on file confirmed.

## 2017-12-29 NOTE — Telephone Encounter (Signed)
Sent!

## 2017-12-30 NOTE — Telephone Encounter (Signed)
Left detailed message at number provided [phone], advised generic for OCP has been sent to pharmacy on file and to return call with any further questions. Encounter closed.

## 2018-01-06 ENCOUNTER — Telehealth: Payer: Self-pay

## 2018-01-06 MED ORDER — NORETHIN ACE-ETH ESTRAD-FE 1-20 MG-MCG PO TABS: 1.0000 | ORAL_TABLET | Freq: Every day | ORAL | 3 refills | Status: DC

## 2018-01-06 NOTE — Telephone Encounter (Signed)
Will change her to loestrin 1/20, 1 year supply called in. Please inform the patient

## 2018-01-06 NOTE — Telephone Encounter (Signed)
Received faxed refill request from [facility] on Tri-Previfem. Pharmacy states generic is on back order and brand name is too expensive. Patient is requesting an alternative.  Medication refill request: Tri-Previfem   Last AEX: 12-14-17 Next AEX: none scheduled Last MMG (if hormonal medication request): n/a Refill authorized: Please advise

## 2018-01-07 NOTE — Telephone Encounter (Signed)
Called patient and per DPR, left detailed message stating Tri-Previfem on back order and Dr.Jertson sent in Rx for Loestrinb 1/20 for 1 year. Call if any questions.

## 2018-05-20 DIAGNOSIS — M67431 Ganglion, right wrist: Secondary | ICD-10-CM | POA: Insufficient documentation

## 2018-05-26 ENCOUNTER — Other Ambulatory Visit: Payer: Self-pay | Admitting: *Deleted

## 2018-05-26 ENCOUNTER — Encounter: Payer: Self-pay | Admitting: Obstetrics and Gynecology

## 2018-05-26 NOTE — Telephone Encounter (Signed)
My Chart message from patient:  ----- Message from Mychart, Generic sent at 05/26/2018 6:19 PM EDT -----    Hi, Dr. Oscar LaJertson,    I know we have switched my birth control a lot, but I have had more side effects on the Junel, such as acne breakouts and weight gain. Also, my period has been getting progressively lighter (last month I just had some spotting, which is unusual for me) each month that I have been on it, while it was regular and very predictable on the ortho-tri-cyclen. Is there another tri-cyclic birth control that I could be put on? I really liked the ortho-tri-cyclen, but our insurance changed and it became $50/month. I know the generic is hard to come by, but is there another similar generic I could get put on? I am on pack 2/3 of the Junel currently.    Thanks for your help!    Kathryn FairySara Jenkins

## 2018-05-26 NOTE — Telephone Encounter (Signed)
Phone note created. Encounter closed.

## 2018-05-31 NOTE — Telephone Encounter (Signed)
Can you please check with the pharmacist if there is a similar generic that she can get on her plan. If acne is an issue Kathryn FieldYaz is a good option, but it could increase her risk of a blood clot slightly.

## 2018-05-31 NOTE — Telephone Encounter (Signed)
Left message to call Aarya Robinson at [phone].  Reviewed with Dr.Jertson. Okay to prescribe Tri Sprintec.

## 2018-06-01 ENCOUNTER — Other Ambulatory Visit: Payer: Self-pay | Admitting: Obstetrics and Gynecology

## 2018-06-01 MED ORDER — NORGESTIM-ETH ESTRAD TRIPHASIC 0.18/0.215/0.25 MG-35 MCG PO TABS: 1.0000 | ORAL_TABLET | Freq: Every day | ORAL | 1 refills | Status: DC

## 2018-06-01 NOTE — Telephone Encounter (Signed)
Spoke with patient. Advised reviewed formulary with Dr.Jertson and okay to prescribe Tri Sprintec which is a lower tier medication on her insurance that is similar to Ortho Tri Cyclen. Patient verbalizes understanding. Rx #3 1RF sent to pharmacy on file. Patient will call in 3 months with update on how she is doing on new medication.  Routing to provider for final review. Patient agreeable to disposition. Will close encounter.

## 2018-06-01 NOTE — Telephone Encounter (Signed)
Patient is returning a call to DriftwoodKaitlyn. Patient asked that you call her after 12:00pm.

## 2018-06-01 NOTE — Telephone Encounter (Signed)
Medication refill request: tri-previfem Last AEX: 12/14/17  Next AEX: nothing scheduled at this time.  Last MMG (if hormonal medication request): NA Refill authorized: Alternative Requested:Insurance requires  ORTO TRI CYCLEN.

## 2018-08-20 ENCOUNTER — Telehealth: Payer: Self-pay | Admitting: Obstetrics and Gynecology

## 2018-08-20 NOTE — Telephone Encounter (Signed)
Patient is calling with a medication update.

## 2018-08-20 NOTE — Telephone Encounter (Signed)
Patient calling with update. She states she feels much improved on new rx Tri previfem and very happy on these pills.  Has Rx to last until next annual exam with Dr. Oscar La.  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  Update to Dr. Oscar La.  Encounter closed.

## 2018-11-10 ENCOUNTER — Other Ambulatory Visit: Payer: Self-pay | Admitting: Obstetrics and Gynecology

## 2018-11-15 ENCOUNTER — Other Ambulatory Visit: Payer: Self-pay | Admitting: Obstetrics and Gynecology

## 2018-11-15 MED ORDER — NORETHIN ACE-ETH ESTRAD-FE 1-20 MG-MCG PO TABS: 1.0000 | ORAL_TABLET | Freq: Every day | ORAL | 0 refills | Status: DC

## 2018-11-15 NOTE — Telephone Encounter (Signed)
Patient requesting a refill on her birth control. [facility] [address] at [phone]. Patient is scheduled for aex 12/24/17.

## 2018-11-15 NOTE — Telephone Encounter (Signed)
Medication refill request: Junel FE Last AEX:  12/14/17 Next AEX: 12/24/18 Last MMG (if hormonal medication request): Na Refill authorized: #3 packs 0 rf

## 2018-12-23 NOTE — Progress Notes (Signed)
23 y.o. G0P0000 Single White or Caucasian Not Hispanic or Latino female here for annual exam.   Period Cycle (Days): 28 Period Duration (Days):  5 Period Pattern: Regular Menstrual Flow: Moderate Menstrual Control: Maxi pad, Tampon Menstrual Control Change Freq (Hours): changes pad/tampon every 6-8 hours Dysmenorrhea: (!) Mild Dysmenorrhea Symptoms: Other (Comment)(back pain)  Not sexually active in the last year. She c/o weight gain in the last few years. Up 18 lbs in 2 years. She c/o anxiety, prior to tests not sleeping well.   Patient's last menstrual period was 12/17/2018.          Sexually active: No.  The current method of family planning is OCP (estrogen/progesterone).    Exercising: Yes.    weights Smoker:  no  Health Maintenance: Pap:  11-27-16 WNL  History of abnormal Pap:  no TDaP:  11-27-16 Gardasil: completed all 3    reports that she has never smoked. She has never used smokeless tobacco. She reports current alcohol use of about 2.0 - 4.0 standard drinks of alcohol per week. She reports that she does not use drugs. In [employer] school, graduates in 12/20.  History reviewed. No pertinent past medical history.  Past Surgical History:  Procedure Laterality Date  . WISDOM TOOTH EXTRACTION      Current Outpatient Medications  Medication Sig Dispense Refill  . cholecalciferol (VITAMIN D) 1000 units tablet Take 1,000 Units by mouth daily.    . Norgestimate-Ethinyl Estradiol Triphasic 0.18/0.215/0.25 MG-35 MCG tablet Take 1 tablet by mouth daily. 3 Package 3   No current facility-administered medications for this visit.     Family History  Problem Relation Age of Onset  . Thyroid disease Mother   . Thyroid disease Father   . Colon polyps Father   . Diabetes Paternal Aunt   . Diabetes Maternal Grandfather   . Cancer Maternal Grandmother     Review of Systems  Constitutional: Negative.   HENT: Negative.   Eyes: Negative.   Respiratory: Negative.   Cardiovascular:  Positive for leg swelling.  Gastrointestinal: Positive for abdominal distention.  Endocrine: Negative.   Genitourinary: Negative.   Musculoskeletal: Negative.   Skin: Positive for rash.  Allergic/Immunologic: Negative.   Neurological: Negative.   Hematological: Negative.   Psychiatric/Behavioral: The patient is nervous/anxious.     Exam:   BP 110/70 (BP Location: Right Arm, Patient Position: Sitting, Cuff Size: Large)   Pulse 80   Resp 14   Ht 5\' 5"  (1.651 m)   Wt 188 lb 6.4 oz (85.5 kg)   LMP 12/17/2018   BMI 31.35 kg/m   Weight change: @WEIGHTCHANGE @ Height:   Height: 5\' 5"  (165.1 cm)  Ht Readings from Last 3 Encounters:  12/24/18 5\' 5"  (1.651 m)  12/14/17 5' 5.25" (1.657 m)  12/31/16 5' 8.25" (1.734 m)    General appearance: alert, cooperative and appears stated age Head: Normocephalic, without obvious abnormality, atraumatic Neck: no adenopathy, supple, symmetrical, trachea midline and thyroid normal to inspection and palpation Lungs: clear to auscultation bilaterally Cardiovascular: regular rate and rhythm Breasts: normal appearance, no masses or tenderness Abdomen: soft, non-tender; non distended,  no masses,  no organomegaly Extremities: extremities normal, atraumatic, no cyanosis or edema Skin: Skin color, texture, turgor normal. No rashes or lesions Lymph nodes: Cervical, supraclavicular, and axillary nodes normal. No abnormal inguinal nodes palpated Neurologic: Grossly normal   Pelvic: External genitalia:  no lesions              Urethra:  normal appearing urethra  with no masses, tenderness or lesions              Bartholins and Skenes: normal                 Vagina: normal appearing vagina with a slight increase in white watery vaginal discharge              Cervix: no lesions               Bimanual Exam:  Uterus:  normal size, contour, position, consistency, mobility, non-tender              Adnexa: no mass, fullness, tenderness               Rectovaginal:  Confirms               Anus:  normal sphincter tone, no lesions  Chaperone was present for exam.  A:  Well Woman with normal exam  On OCP's  Anxiety  Vit d def, on vit d  Vaginal d/c on exam, on questioning the patient does c/o a slight increase in vaginal discharge  P:   No pap this year  Discussed breast self exam  Discussed calcium and vit D intake  Screening labs, vit d, TSH  Will start Celexa, f/u in one month  Continue OCP's  Affirm sent

## 2018-12-24 ENCOUNTER — Other Ambulatory Visit: Payer: Self-pay

## 2018-12-24 ENCOUNTER — Ambulatory Visit (INDEPENDENT_AMBULATORY_CARE_PROVIDER_SITE_OTHER): Payer: 59 | Admitting: Obstetrics and Gynecology

## 2018-12-24 ENCOUNTER — Encounter: Payer: Self-pay | Admitting: Obstetrics and Gynecology

## 2018-12-24 VITALS — BP 110/70 | HR 80 | Resp 14 | Ht 65.0 in | Wt 188.4 lb

## 2018-12-24 DIAGNOSIS — N898 Other specified noninflammatory disorders of vagina: Secondary | ICD-10-CM

## 2018-12-24 DIAGNOSIS — Z01419 Encounter for gynecological examination (general) (routine) without abnormal findings: Secondary | ICD-10-CM | POA: Diagnosis not present

## 2018-12-24 DIAGNOSIS — E559 Vitamin D deficiency, unspecified: Secondary | ICD-10-CM | POA: Diagnosis not present

## 2018-12-24 DIAGNOSIS — R635 Abnormal weight gain: Secondary | ICD-10-CM

## 2018-12-24 DIAGNOSIS — Z0000 Encounter for general adult medical examination without abnormal findings: Secondary | ICD-10-CM

## 2018-12-24 DIAGNOSIS — F419 Anxiety disorder, unspecified: Secondary | ICD-10-CM

## 2018-12-24 MED ORDER — NORGESTIM-ETH ESTRAD TRIPHASIC 0.18/0.215/0.25 MG-35 MCG PO TABS: 1.0000 | ORAL_TABLET | Freq: Every day | ORAL | 3 refills | Status: DC

## 2018-12-24 MED ORDER — CITALOPRAM HYDROBROMIDE 20 MG PO TABS: 20.0000 mg | ORAL_TABLET | Freq: Every day | ORAL | 1 refills | Status: DC

## 2018-12-24 NOTE — Patient Instructions (Signed)
EXERCISE AND DIET:  We recommended that you start or continue a regular exercise program for good health. Regular exercise means any activity that makes your heart beat faster and makes you sweat.  We recommend exercising at least 30 minutes per day at least 3 days a week, preferably 4 or 5.  We also recommend a diet low in fat and sugar.  Inactivity, poor dietary choices and obesity can cause diabetes, heart attack, stroke, and kidney damage, among others.    ALCOHOL AND SMOKING:  Women should limit their alcohol intake to no more than 7 drinks/beers/glasses of wine (combined, not each!) per week. Moderation of alcohol intake to this level decreases your risk of breast cancer and liver damage. And of course, no recreational drugs are part of a healthy lifestyle.  And absolutely no smoking or even second hand smoke. Most people know smoking can cause heart and lung diseases, but did you know it also contributes to weakening of your bones? Aging of your skin?  Yellowing of your teeth and nails?  CALCIUM AND VITAMIN D:  Adequate intake of calcium and Vitamin D are recommended.  The recommendations for exact amounts of these supplements seem to change often, but generally speaking 1,000 mg of calcium (between diet and supplement) and 800 units of Vitamin D per day seems prudent. Certain women may benefit from higher intake of Vitamin D.  If you are among these women, your doctor will have told you during your visit.    PAP SMEARS:  Pap smears, to check for cervical cancer or precancers,  have traditionally been done yearly, although recent scientific advances have shown that most women can have pap smears less often.  However, every woman still should have a physical exam from her gynecologist every year. It will include a breast check, inspection of the vulva and vagina to check for abnormal growths or skin changes, a visual exam of the cervix, and then an exam to evaluate the size and shape of the uterus and  ovaries.  And after 24 years of age, a rectal exam is indicated to check for rectal cancers. We will also provide age appropriate advice regarding health maintenance, like when you should have certain vaccines, screening for sexually transmitted diseases, bone density testing, colonoscopy, mammograms, etc.   MAMMOGRAMS:  All women over 40 years old should have a yearly mammogram. Many facilities now offer a "3D" mammogram, which may cost around $50 extra out of pocket. If possible,  we recommend you accept the option to have the 3D mammogram performed.  It both reduces the number of women who will be called back for extra views which then turn out to be normal, and it is better than the routine mammogram at detecting truly abnormal areas.    COLON CANCER SCREENING: Now recommend starting at age 45. At this time colonoscopy is not covered for routine screening until 50. There are take home tests that can be done between 45-49.   COLONOSCOPY:  Colonoscopy to screen for colon cancer is recommended for all women at age 50.  We know, you hate the idea of the prep.  We agree, BUT, having colon cancer and not knowing it is worse!!  Colon cancer so often starts as a polyp that can be seen and removed at colonscopy, which can quite literally save your life!  And if your first colonoscopy is normal and you have no family history of colon cancer, most women don't have to have it again for   10 years.  Once every ten years, you can do something that may end up saving your life, right?  We will be happy to help you get it scheduled when you are ready.  Be sure to check your insurance coverage so you understand how much it will cost.  It may be covered as a preventative service at no cost, but you should check your particular policy.      Breast Self-Awareness Breast self-awareness means being familiar with how your breasts look and feel. It involves checking your breasts regularly and reporting any changes to your  health care provider. Practicing breast self-awareness is important. A change in your breasts can be a sign of a serious medical problem. Being familiar with how your breasts look and feel allows you to find any problems early, when treatment is more likely to be successful. All women should practice breast self-awareness, including women who have had breast implants. How to do a breast self-exam One way to learn what is normal for your breasts and whether your breasts are changing is to do a breast self-exam. To do a breast self-exam: Look for Changes  1. Remove all the clothing above your waist. 2. Stand in front of a mirror in a room with good lighting. 3. Put your hands on your hips. 4. Push your hands firmly downward. 5. Compare your breasts in the mirror. Look for differences between them (asymmetry), such as: ? Differences in shape. ? Differences in size. ? Puckers, dips, and bumps in one breast and not the other. 6. Look at each breast for changes in your skin, such as: ? Redness. ? Scaly areas. 7. Look for changes in your nipples, such as: ? Discharge. ? Bleeding. ? Dimpling. ? Redness. ? A change in position. Feel for Changes Carefully feel your breasts for lumps and changes. It is best to do this while lying on your back on the floor and again while sitting or standing in the shower or tub with soapy water on your skin. Feel each breast in the following way:  Place the arm on the side of the breast you are examining above your head.  Feel your breast with the other hand.  Start in the nipple area and make  inch (2 cm) overlapping circles to feel your breast. Use the pads of your three middle fingers to do this. Apply light pressure, then medium pressure, then firm pressure. The light pressure will allow you to feel the tissue closest to the skin. The medium pressure will allow you to feel the tissue that is a little deeper. The firm pressure will allow you to feel the tissue  close to the ribs.  Continue the overlapping circles, moving downward over the breast until you feel your ribs below your breast.  Move one finger-width toward the center of the body. Continue to use the  inch (2 cm) overlapping circles to feel your breast as you move slowly up toward your collarbone.  Continue the up and down exam using all three pressures until you reach your armpit.  Write Down What You Find  Write down what is normal for each breast and any changes that you find. Keep a written record with breast changes or normal findings for each breast. By writing this information down, you do not need to depend only on memory for size, tenderness, or location. Write down where you are in your menstrual cycle, if you are still menstruating. If you are having trouble noticing differences   in your breasts, do not get discouraged. With time you will become more familiar with the variations in your breasts and more comfortable with the exam. How often should I examine my breasts? Examine your breasts every month. If you are breastfeeding, the best time to examine your breasts is after a feeding or after using a breast pump. If you menstruate, the best time to examine your breasts is 5-7 days after your period is over. During your period, your breasts are lumpier, and it may be more difficult to notice changes. When should I see my health care provider? See your health care provider if you notice:  A change in shape or size of your breasts or nipples.  A change in the skin of your breast or nipples, such as a reddened or scaly area.  Unusual discharge from your nipples.  A lump or thick area that was not there before.  Pain in your breasts.  Anything that concerns you.  

## 2018-12-25 LAB — CBC
HEMATOCRIT: 42.6 % (ref 34.0–46.6)
Hemoglobin: 14.6 g/dL (ref 11.1–15.9)
MCH: 29.1 pg (ref 26.6–33.0)
MCHC: 34.3 g/dL (ref 31.5–35.7)
MCV: 85 fL (ref 79–97)
Platelets: 319 10*3/uL (ref 150–450)
RBC: 5.01 x10E6/uL (ref 3.77–5.28)
RDW: 12.5 % (ref 11.7–15.4)
WBC: 7.6 10*3/uL (ref 3.4–10.8)

## 2018-12-25 LAB — COMPREHENSIVE METABOLIC PANEL
A/G RATIO: 1.6 (ref 1.2–2.2)
ALT: 16 IU/L (ref 0–32)
AST: 18 IU/L (ref 0–40)
Albumin: 4.2 g/dL (ref 3.9–5.0)
Alkaline Phosphatase: 85 IU/L (ref 39–117)
BILIRUBIN TOTAL: 0.4 mg/dL (ref 0.0–1.2)
BUN/Creatinine Ratio: 17 (ref 9–23)
BUN: 15 mg/dL (ref 6–20)
CO2: 23 mmol/L (ref 20–29)
Calcium: 9.3 mg/dL (ref 8.7–10.2)
Chloride: 100 mmol/L (ref 96–106)
Creatinine, Ser: 0.87 mg/dL (ref 0.57–1.00)
GFR calc Af Amer: 109 mL/min/{1.73_m2} (ref >59)
GFR, EST NON AFRICAN AMERICAN: 94 mL/min/{1.73_m2} (ref >59)
GLOBULIN, TOTAL: 2.7 g/dL (ref 1.5–4.5)
Glucose: 83 mg/dL (ref 65–99)
POTASSIUM: 4.4 mmol/L (ref 3.5–5.2)
SODIUM: 140 mmol/L (ref 134–144)
Total Protein: 6.9 g/dL (ref 6.0–8.5)

## 2018-12-25 LAB — LIPID PANEL
Chol/HDL Ratio: 2.5 ratio (ref 0.0–4.4)
Cholesterol, Total: 184 mg/dL (ref 100–199)
HDL: 73 mg/dL (ref >39)
LDL Calculated: 94 mg/dL (ref 0–99)
Triglycerides: 87 mg/dL (ref 0–149)
VLDL Cholesterol Cal: 17 mg/dL (ref 5–40)

## 2018-12-25 LAB — VAGINITIS/VAGINOSIS, DNA PROBE
CANDIDA SPECIES: NEGATIVE
Gardnerella vaginalis: NEGATIVE
Trichomonas vaginosis: NEGATIVE

## 2018-12-25 LAB — TSH: TSH: 0.841 u[IU]/mL (ref 0.450–4.500)

## 2018-12-25 LAB — VITAMIN D 25 HYDROXY (VIT D DEFICIENCY, FRACTURES): Vit D, 25-Hydroxy: 31.6 ng/mL (ref 30.0–100.0)

## 2018-12-28 ENCOUNTER — Telehealth: Payer: Self-pay | Admitting: Obstetrics and Gynecology

## 2018-12-28 NOTE — Telephone Encounter (Signed)
Patient is calling stated that she is not doing well on Celexa. Patient is currently experiencing "debilitating nausea." Patient stated that she is currently taking half a dose, but did not take it last night. Patient is wanting to start on something different, but would like to wait a week or so due to school obligations.

## 2018-12-28 NOTE — Telephone Encounter (Signed)
Dr. Oscar La,  Please advise on medication management. Okay for patient to DC celexa? Needs office visit for new medication or PCP referral for   medication management?

## 2018-12-28 NOTE — Telephone Encounter (Signed)
I'm so sorry she is having this reaction. She can just stop the celexa. I'm happy to call in another SSRI for her. They all list nausea as a possible side effect, typically the symptoms resolve. If she would like, you can call in lexapro 10 mg, 1/2 a tablet a day for a week, increase to one tablet if tolerating. #30, 1 refill.

## 2018-12-29 MED ORDER — ESCITALOPRAM OXALATE 10 MG PO TABS: ORAL_TABLET | ORAL | 1 refills | Status: DC

## 2018-12-29 NOTE — Telephone Encounter (Signed)
8882 Late entry.  Spoke with patient and message from Dr. Oscar La discussed.  Patient would like to start Lexapro.  She will start next week after her finals for school.  Instructions given on start of Lexapro and side effects reviewed.  Will close encounter.

## 2019-01-27 ENCOUNTER — Telehealth: Payer: Self-pay | Admitting: Obstetrics and Gynecology

## 2019-01-27 NOTE — Telephone Encounter (Signed)
Patient states that the wrong prescription was sent to her pharmacy. She states the [facility] [place], [place]. She said this prescription was supposed to be changed.

## 2019-01-27 NOTE — Telephone Encounter (Signed)
Spoke with patient. Patient states no further assistance needed, escitalopram(Lexapro) Rx was at the pharmacy, no further assistance needed. Thankful for return call.  Encounter closed.

## 2019-01-28 ENCOUNTER — Ambulatory Visit: Payer: 59 | Admitting: Obstetrics and Gynecology

## 2019-02-09 ENCOUNTER — Telehealth: Payer: Self-pay | Admitting: Obstetrics and Gynecology

## 2019-02-09 MED ORDER — ESCITALOPRAM OXALATE 10 MG PO TABS: ORAL_TABLET | ORAL | 3 refills | Status: DC

## 2019-02-09 NOTE — Telephone Encounter (Signed)
Patient called and cancelled her medication recheck on 02/14/19. She is out of town and concerned about traveling and the [place] virus at this time. She said she is doing well and declineedon the new medication and wants to continue taking it. She'd like a new prescription sent to the pharmacy below.   [facility] [address] [place], [place]

## 2019-02-09 NOTE — Telephone Encounter (Signed)
Rx sent to pharmacy on file. Patient has been notified. Encounter closed.

## 2019-02-09 NOTE — Telephone Encounter (Signed)
Please refill her lexapro script as requested. lexapro 10 mg q d, #90, 3 refills to the new pharmacy.

## 2019-02-14 ENCOUNTER — Ambulatory Visit: Payer: 59 | Admitting: Obstetrics and Gynecology

## 2019-02-25 ENCOUNTER — Telehealth: Payer: Self-pay | Admitting: Obstetrics and Gynecology

## 2019-02-25 NOTE — Telephone Encounter (Signed)
Patient would like prescriptions for escitalopram and birth control to be [facility] [address] [facility] in [place]. This is only to be done one time as patient is home from school due to COVID. If possible, she would like a 3 month supply.

## 2019-02-25 NOTE — Telephone Encounter (Signed)
Spoke with patient. Advised may contact her [facility] to have prescriptions transferred. Patient verbalizes understanding. Encounter closed.

## 2019-04-12 ENCOUNTER — Ambulatory Visit (INDEPENDENT_AMBULATORY_CARE_PROVIDER_SITE_OTHER): Payer: 59 | Admitting: Obstetrics and Gynecology

## 2019-04-12 ENCOUNTER — Other Ambulatory Visit: Payer: Self-pay | Admitting: Obstetrics and Gynecology

## 2019-04-12 ENCOUNTER — Other Ambulatory Visit: Payer: Self-pay

## 2019-04-12 ENCOUNTER — Telehealth: Payer: Self-pay | Admitting: Obstetrics and Gynecology

## 2019-04-12 ENCOUNTER — Encounter: Payer: Self-pay | Admitting: Obstetrics and Gynecology

## 2019-04-12 VITALS — BP 122/72 | HR 60 | Temp 98.2°F | Wt 203.0 lb

## 2019-04-12 DIAGNOSIS — N6310 Unspecified lump in the right breast, unspecified quadrant: Secondary | ICD-10-CM

## 2019-04-12 DIAGNOSIS — N6315 Unspecified lump in the right breast, overlapping quadrants: Secondary | ICD-10-CM

## 2019-04-12 NOTE — Telephone Encounter (Signed)
Spoke with patient. Reports painful right breast lump that she noticed 1.5 wks ago. Denies nipple d/c, redness, skin changes, fever/chills. LMP approximately 1 month ago. Requesting OV. [id number] screening completed.   OV scheduled for today at 4:30pm with Dr. Oscar La. Patient verbalizes understanding and is agreeable.   Encounter closed.

## 2019-04-12 NOTE — Progress Notes (Signed)
GYNECOLOGY  VISIT   HPI: 23 y.o.   Single White or Caucasian Not Hispanic or Latino  female   G0P0000 with No LMP recorded.   here for evaluation of a right breast lump. She noticed a small tender lump 1.5-2 weeks ago. Last week in the shower it was bigger and more tender, now smaller and not tender.  Denies any redness, swelling, warmth, or nipple discharge.   GYNECOLOGIC HISTORY: No LMP recorded.  Patient is unsure of exact date Contraception: OCP Menopausal hormone therapy: None        OB History    Gravida  0   Para  0   Term  0   Preterm  0   AB  0   Living  0     SAB  0   TAB  0   Ectopic  0   Multiple  0   Live Births  0              Patient Active Problem List   Diagnosis Date Noted  . 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 Hirsutism 06/21/2015    History reviewed. No pertinent past medical history.  Past Surgical History:  Procedure Laterality Date  . WISDOM TOOTH EXTRACTION      Current Outpatient Medications  Medication Sig Dispense Refill  . cholecalciferol (VITAMIN D) 1000 units tablet Take 1,000 Units by mouth daily. 2 tablets daily    . escitalopram (LEXAPRO) 10 MG tablet 1 tablet po daily 90 tablet 3  . Norgestimate-Ethinyl Estradiol Triphasic 0.18/0.215/0.25 MG-35 MCG tablet Take 1 tablet by mouth daily. 3 Package 3   No current facility-administered medications for this visit.      ALLERGIES: Penicillins  Family History  Problem Relation Age of Onset  . Thyroid disease Mother   . Thyroid disease Father   . Colon polyps Father   . Diabetes Paternal Aunt   . Diabetes Maternal Grandfather   . Cancer Maternal Grandmother     Social History   Socioeconomic History  . Marital status: Single    Spouse name: Not on file  . Number of children: Not on file  . Years of education: Not on file  . Highest education level: Not on file  Occupational History  . Not on file  Social Needs  . Financial resource strain: Not on file  . Food  insecurity:    Worry: Not on file    Inability: Not on file  . Transportation needs:    Medical: Not on file    Non-medical: Not on file  Tobacco Use  . Smoking status: Never Smoker  . Smokeless tobacco: Never Used  Substance and Sexual Activity  . Alcohol use: Yes    Alcohol/week: 2.0 - 4.0 standard drinks    Types: 2 - 4 Standard drinks or equivalent per week  . Drug use: No  . Sexual activity: Not Currently    Partners: Male    Birth control/protection: Pill  Lifestyle  . Physical activity:    Days per week: Not on file    Minutes per session: Not on file  . Stress: Not on file  Relationships  . Social connections:    Talks on phone: Not on file    Gets together: Not on file    Attends religious service: Not on file    Active member of club or organization: Not on file    Attends meetings of clubs or organizations: Not on file    Relationship status: Not  on file  . Intimate partner violence:    Fear of current or ex partner: Not on file    Emotionally abused: Not on file    Physically abused: Not on file    Forced sexual activity: Not on file  Other Topics Concern  . Not on file  Social History Narrative  . Not on file    Review of Systems  Constitutional:       Right breast lump  HENT: Negative.   Eyes: Negative.   Respiratory: Negative.   Cardiovascular: Negative.   Gastrointestinal: Negative.   Genitourinary: Negative.   Musculoskeletal: Negative.   Skin: Negative.   Neurological: Negative.   Endo/Heme/Allergies: Negative.   Psychiatric/Behavioral: Negative.     PHYSICAL EXAMINATION:    BP 122/72 (BP Location: Right Arm, Patient Position: Sitting, Cuff Size: Normal)   Pulse 60   Temp 98.2 F (36.8 C) (Skin)   Wt 203 lb (92.1 kg)   BMI 33.78 kg/m     General appearance: alert, cooperative and appears stated age Breasts: in the right breast at ~3 o'clock is a pea sized, mobile lump, noted with deep palpation several cm from the areolar region.  Similar but less distinct lumpiness on the left.  Patient examined supine and sitting. No supraclavicular or axillary adenopathy.   ASSESSMENT Right breat lump    PLAN Discussed the option of repeating her exam after her cycle or setting her up for an ultrasound. She prefers a ultrasound, will schedule.    An After Visit Summary was printed and given to the patient.

## 2019-04-12 NOTE — Telephone Encounter (Signed)
Message   Appointment Request From: [name]    With Provider: Romualdo Bolk, MD [facility]]    Preferred Date Range: 04/12/2019 - 04/15/2019    Preferred Times: Any time    Reason for visit: Request an Appointment    Comments:  I found a lump in my breast that I would like to get evaluated. It is new, which is concerning me.

## 2019-04-27 ENCOUNTER — Ambulatory Visit
Admission: RE | Admit: 2019-04-27 | Discharge: 2019-04-27 | Disposition: A | Discharge status: Home / Self Care | Payer: 59 | Source: Ambulatory Visit | Attending: Obstetrics and Gynecology | Admitting: Obstetrics and Gynecology

## 2019-04-27 ENCOUNTER — Other Ambulatory Visit: Payer: Self-pay

## 2019-04-27 DIAGNOSIS — N6315 Unspecified lump in the right breast, overlapping quadrants: Secondary | ICD-10-CM

## 2019-07-19 NOTE — Progress Notes (Signed)
GYNECOLOGY  VISIT   HPI: 23 y.o.   Single White or Caucasian Not Hispanic or Latino  female   G0P0000 with Patient's last menstrual period was 07/13/2019 (approximate).   here for breast recheck.  The patient noticed a lump in her breast in 5/20, on exam she was noted to have a pea sized lump in the right breast at ~3 o'clock, deep, several cm from the areolar region. Negative breast ultrasound.  GYNECOLOGIC HISTORY: Patient's last menstrual period was 07/13/2019 (approximate). Contraception: OCP Menopausal hormone therapy: None        OB History    Gravida  0   Para  0   Term  0   Preterm  0   AB  0   Living  0     SAB  0   TAB  0   Ectopic  0   Multiple  0   Live Births  0              Patient Active Problem List   Diagnosis Date Noted  . 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 Hirsutism 06/21/2015    History reviewed. No pertinent past medical history.  Past Surgical History:  Procedure Laterality Date  . WISDOM TOOTH EXTRACTION      Current Outpatient Medications  Medication Sig Dispense Refill  . cetirizine (ZYRTEC) 5 MG tablet Take 5 mg by mouth daily.    . cholecalciferol (VITAMIN D) 1000 units tablet Take 1,000 Units by mouth daily. 2 tablets daily    . escitalopram (LEXAPRO) 10 MG tablet 1 tablet po daily 90 tablet 3  . fluticasone (FLONASE) 50 MCG/ACT nasal spray Place 1 spray into both nostrils daily.    . Norgestimate-Ethinyl Estradiol Triphasic 0.18/0.215/0.25 MG-35 MCG tablet Take 1 tablet by mouth daily. 3 Package 3   No current facility-administered medications for this visit.      ALLERGIES: Penicillins  Family History  Problem Relation Age of Onset  . Thyroid disease Mother   . Thyroid disease Father   . Colon polyps Father   . Diabetes Paternal Aunt   . Diabetes Maternal Grandfather   . Cancer Maternal Grandmother     Social History   Socioeconomic History  . Marital status: Single    Spouse name: Not on file  . Number of  children: Not on file  . Years of education: Not on file  . Highest education level: Not on file  Occupational History  . Not on file  Social Needs  . Financial resource strain: Not on file  . Food insecurity    Worry: Not on file    Inability: Not on file  . Transportation needs    Medical: Not on file    Non-medical: Not on file  Tobacco Use  . Smoking status: Never Smoker  . Smokeless tobacco: Never Used  Substance and Sexual Activity  . Alcohol use: Yes    Comment: once a month  . Drug use: No  . Sexual activity: Not Currently    Partners: Male    Birth control/protection: Pill  Lifestyle  . Physical activity    Days per week: Not on file    Minutes per session: Not on file  . Stress: Not on file  Relationships  . Social Herbalist on phone: Not on file    Gets together: Not on file    Attends religious service: Not on file    Active member of club or organization: Not on file  Attends meetings of clubs or organizations: Not on file    Relationship status: Not on file  . Intimate partner violence    Fear of current or ex partner: Not on file    Emotionally abused: Not on file    Physically abused: Not on file    Forced sexual activity: Not on file  Other Topics Concern  . Not on file  Social History Narrative  . Not on file    Review of Systems  Constitutional: Negative.   HENT: Negative.   Eyes: Negative.   Respiratory: Negative.   Cardiovascular: Negative.   Gastrointestinal: Negative.   Genitourinary: Negative.   Musculoskeletal: Negative.   Skin: Negative.   Neurological: Negative.   Endo/Heme/Allergies: Negative.   Psychiatric/Behavioral: Negative.     PHYSICAL EXAMINATION:    BP 110/72 (BP Location: Right Arm, Patient Position: Sitting, Cuff Size: Normal)   Pulse 72   Temp (!) 97.2 F (36.2 C) (Skin)   Wt 217 lb (98.4 kg)   LMP 07/13/2019 (Approximate)   BMI 36.11 kg/m     General appearance: alert, cooperative and appears  stated age Breasts: normal appearance, no masses or tenderness No cervical or supraclavicular adenopathy   ASSESSMENT Breast lump, negative imaging. Normal exam today    PLAN Routine f/u   An After Visit Summary was printed and given to the patient.

## 2019-07-27 ENCOUNTER — Ambulatory Visit (INDEPENDENT_AMBULATORY_CARE_PROVIDER_SITE_OTHER): Payer: 59 | Admitting: Obstetrics and Gynecology

## 2019-07-27 ENCOUNTER — Other Ambulatory Visit: Payer: Self-pay

## 2019-07-27 ENCOUNTER — Encounter: Payer: Self-pay | Admitting: Obstetrics and Gynecology

## 2019-07-27 VITALS — BP 110/72 | HR 72 | Temp 97.2°F | Wt 217.0 lb

## 2019-07-27 DIAGNOSIS — Z87898 Personal history of other specified conditions: Secondary | ICD-10-CM | POA: Diagnosis not present

## 2019-11-12 ENCOUNTER — Other Ambulatory Visit: Payer: Self-pay | Admitting: Obstetrics and Gynecology

## 2019-11-14 NOTE — Telephone Encounter (Signed)
Medication refill request: OCP Last AEX:  12-24-2018 JJ  Next AEX: message left for patient to call and schedule aex  Last MMG (if hormonal medication request): n/a Refill authorized: Today, please advise.   Medication pended for #28, 0RF. Please refill if appropriate.

## 2019-11-16 ENCOUNTER — Other Ambulatory Visit: Payer: Self-pay | Admitting: Obstetrics and Gynecology

## 2019-11-16 DIAGNOSIS — F419 Anxiety disorder, unspecified: Secondary | ICD-10-CM

## 2019-11-16 MED ORDER — ESCITALOPRAM OXALATE 10 MG PO TABS: ORAL_TABLET | ORAL | 0 refills | Status: DC

## 2019-11-16 NOTE — Telephone Encounter (Signed)
Patient needs refill on lexapro. Annual exam scheduled for 12/26/2019. Would like 3 month supply, if possible.

## 2019-11-16 NOTE — Telephone Encounter (Signed)
Left detailed message per DPR. Pt wants refill of lexapro until AEX in 12/26/2019.   Will route to Dr Talbert Nan. Orders pended if approved. # 90, 0RF.

## 2019-12-22 ENCOUNTER — Other Ambulatory Visit: Payer: Self-pay

## 2019-12-22 NOTE — Progress Notes (Signed)
24 y.o. G0P0000 Single White or Caucasian Not Hispanic or Latino female here for annual exam.   Patient is wanting to talk about weight loss and is interested in IUD information. Not currently sexually active.  Period Cycle (Days): 35 Period Duration (Days): 5 Period Pattern: Regular Menstrual Flow: Moderate Menstrual Control: Tampon Menstrual Control Change Freq (Hours): 4 Dysmenorrhea: None  Some mild cramps.   She has gained over 40 lbs in the last year. Stress with school and covid. She has known knee injury, may need surgery.   Patient's last menstrual period was 12/03/2019.          Sexually active: No.  The current method of family planning is abstinence.    Exercising: Yes.    Walking Smoker:  no  Health Maintenance: Pap:  11-27-16 WNL History of abnormal Pap:  no TDaP:  11/27/16  Gardasil: Completed    reports that she has never smoked. She has never used smokeless tobacco. She reports current alcohol use. She reports that she does not use drugs. Graduated from [employer] in 12/20, looking for a job. 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  History reviewed. No pertinent past medical history.  Past Surgical History:  Procedure Laterality Date  . WISDOM TOOTH EXTRACTION      Current Outpatient Medications  Medication Sig Dispense Refill  . cetirizine (ZYRTEC) 5 MG tablet Take 5 mg by mouth daily.    . cholecalciferol (VITAMIN D) 1000 units tablet Take 1,000 Units by mouth daily. 2 tablets daily    . escitalopram (LEXAPRO) 10 MG tablet 1 tablet po daily 90 tablet 0  . fluticasone (FLONASE) 50 MCG/ACT nasal spray Place 1 spray into both nostrils daily.     No current facility-administered medications for this visit.    Family History  Problem Relation Age of Onset  . Thyroid disease Mother   . Thyroid disease Father   . Colon polyps Father   . Diabetes Paternal Aunt   . Diabetes Maternal Grandfather   . Cancer Maternal Grandmother      Review of Systems  Exam:   BP 110/60   Pulse (!) 107   Temp 97.9 F (36.6 C)   Ht 5' 5.5" (1.664 m)   Wt 231 lb (104.8 kg)   LMP 12/03/2019   SpO2 97%   BMI 37.86 kg/m   Weight change: @WEIGHTCHANGE @ Height:   Height: 5' 5.5" (166.4 cm)  Ht Readings from Last 3 Encounters:  12/26/19 5' 5.5" (1.664 m)  12/24/18 5\' 5"  (1.651 m)  12/14/17 5' 5.25" (1.657 m)    General appearance: alert, cooperative and appears stated age Head: Normocephalic, without obvious abnormality, atraumatic Neck: no adenopathy, supple, symmetrical, trachea midline and thyroid normal to inspection and palpation Lungs: clear to auscultation bilaterally Cardiovascular: regular rate and rhythm Breasts: normal appearance, no masses or tenderness Abdomen: soft, non-tender; non distended,  no masses,  no organomegaly Extremities: extremities normal, atraumatic, no cyanosis or edema Skin: Skin color, texture, turgor normal. No rashes or lesions Lymph nodes: Cervical, supraclavicular, and axillary nodes normal. No abnormal inguinal nodes palpated Neurologic: Grossly normal   Pelvic: External genitalia:  no lesions              Urethra:  normal appearing urethra with no masses, tenderness or lesions              Bartholins and Skenes: normal  Vagina: normal appearing vagina with normal color and discharge, no lesions              Cervix: no lesions               Bimanual Exam:  Uterus:  normal size, contour, position, consistency, mobility, non-tender and anteverted              Adnexa: no mass, fullness, tenderness               Rectovaginal: Confirms               Anus:  normal sphincter tone, no lesions  Carolynn Serve chaperoned for the exam.  A:  Well Woman with normal exam  Weight gain  BMI 37  Contraception, not currently sexually active, information on the mirena IUD given  Vit D def  P:   Pap with reflex hpv  Screening labs, HgbA1C, TSH, vit D  Discussed breast self  exam  Discussed calcium and vit D intake  No STD testing needed

## 2019-12-26 ENCOUNTER — Encounter: Payer: Self-pay | Admitting: Obstetrics and Gynecology

## 2019-12-26 ENCOUNTER — Other Ambulatory Visit: Payer: Self-pay

## 2019-12-26 ENCOUNTER — Ambulatory Visit (INDEPENDENT_AMBULATORY_CARE_PROVIDER_SITE_OTHER): Payer: 59 | Admitting: Obstetrics and Gynecology

## 2019-12-26 VITALS — BP 110/60 | HR 107 | Temp 97.9°F | Ht 65.5 in | Wt 231.0 lb

## 2019-12-26 DIAGNOSIS — R635 Abnormal weight gain: Secondary | ICD-10-CM | POA: Diagnosis not present

## 2019-12-26 DIAGNOSIS — Z01419 Encounter for gynecological examination (general) (routine) without abnormal findings: Secondary | ICD-10-CM | POA: Diagnosis not present

## 2019-12-26 DIAGNOSIS — Z6837 Body mass index (BMI) 37.0-37.9, adult: Secondary | ICD-10-CM

## 2019-12-26 DIAGNOSIS — E559 Vitamin D deficiency, unspecified: Secondary | ICD-10-CM

## 2019-12-26 DIAGNOSIS — Z113 Encounter for screening for infections with a predominantly sexual mode of transmission: Secondary | ICD-10-CM

## 2019-12-26 DIAGNOSIS — Z0000 Encounter for general adult medical examination without abnormal findings: Secondary | ICD-10-CM

## 2019-12-26 NOTE — Patient Instructions (Signed)
EXERCISE AND DIET:  We recommended that you start or continue a regular exercise program for good health. Regular exercise means any activity that makes your heart beat faster and makes you sweat.  We recommend exercising at least 30 minutes per day at least 3 days a week, preferably 4 or 5.  We also recommend a diet low in fat and sugar.  Inactivity, poor dietary choices and obesity can cause diabetes, heart attack, stroke, and kidney damage, among others.    ALCOHOL AND SMOKING:  Women should limit their alcohol intake to no more than 7 drinks/beers/glasses of wine (combined, not each!) per week. Moderation of alcohol intake to this level decreases your risk of breast cancer and liver damage. And of course, no recreational drugs are part of a healthy lifestyle.  And absolutely no smoking or even second hand smoke. Most people know smoking can cause heart and lung diseases, but did you know it also contributes to weakening of your bones? Aging of your skin?  Yellowing of your teeth and nails?  CALCIUM AND VITAMIN D:  Adequate intake of calcium and Vitamin D are recommended.  The recommendations for exact amounts of these supplements seem to change often, but generally speaking 1,000 mg of calcium (between diet and supplement) and 800 units of Vitamin D per day seems prudent. Certain women may benefit from higher intake of Vitamin D.  If you are among these women, your doctor will have told you during your visit.    PAP SMEARS:  Pap smears, to check for cervical cancer or precancers,  have traditionally been done yearly, although recent scientific advances have shown that most women can have pap smears less often.  However, every woman still should have a physical exam from her gynecologist every year. It will include a breast check, inspection of the vulva and vagina to check for abnormal growths or skin changes, a visual exam of the cervix, and then an exam to evaluate the size and shape of the uterus and  ovaries.  And after 25 years of age, a rectal exam is indicated to check for rectal cancers. We will also provide age appropriate advice regarding health maintenance, like when you should have certain vaccines, screening for sexually transmitted diseases, bone density testing, colonoscopy, mammograms, etc.   MAMMOGRAMS:  All women over 40 years old should have a yearly mammogram. Many facilities now offer a "3D" mammogram, which may cost around $50 extra out of pocket. If possible,  we recommend you accept the option to have the 3D mammogram performed.  It both reduces the number of women who will be called back for extra views which then turn out to be normal, and it is better than the routine mammogram at detecting truly abnormal areas.    COLON CANCER SCREENING: Now recommend starting at age 45. At this time colonoscopy is not covered for routine screening until 50. There are take home tests that can be done between 45-49.   COLONOSCOPY:  Colonoscopy to screen for colon cancer is recommended for all women at age 50.  We know, you hate the idea of the prep.  We agree, BUT, having colon cancer and not knowing it is worse!!  Colon cancer so often starts as a polyp that can be seen and removed at colonscopy, which can quite literally save your life!  And if your first colonoscopy is normal and you have no family history of colon cancer, most women don't have to have it again for   10 years.  Once every ten years, you can do something that may end up saving your life, right?  We will be happy to help you get it scheduled when you are ready.  Be sure to check your insurance coverage so you understand how much it will cost.  It may be covered as a preventative service at no cost, but you should check your particular policy.      Breast Self-Awareness Breast self-awareness means being familiar with how your breasts look and feel. It involves checking your breasts regularly and reporting any changes to your  health care provider. Practicing breast self-awareness is important. A change in your breasts can be a sign of a serious medical problem. Being familiar with how your breasts look and feel allows you to find any problems early, when treatment is more likely to be successful. All women should practice breast self-awareness, including women who have had breast implants. How to do a breast self-exam One way to learn what is normal for your breasts and whether your breasts are changing is to do a breast self-exam. To do a breast self-exam: Look for Changes  1. Remove all the clothing above your waist. 2. Stand in front of a mirror in a room with good lighting. 3. Put your hands on your hips. 4. Push your hands firmly downward. 5. Compare your breasts in the mirror. Look for differences between them (asymmetry), such as: ? Differences in shape. ? Differences in size. ? Puckers, dips, and bumps in one breast and not the other. 6. Look at each breast for changes in your skin, such as: ? Redness. ? Scaly areas. 7. Look for changes in your nipples, such as: ? Discharge. ? Bleeding. ? Dimpling. ? Redness. ? A change in position. Feel for Changes Carefully feel your breasts for lumps and changes. It is best to do this while lying on your back on the floor and again while sitting or standing in the shower or tub with soapy water on your skin. Feel each breast in the following way:  Place the arm on the side of the breast you are examining above your head.  Feel your breast with the other hand.  Start in the nipple area and make  inch (2 cm) overlapping circles to feel your breast. Use the pads of your three middle fingers to do this. Apply light pressure, then medium pressure, then firm pressure. The light pressure will allow you to feel the tissue closest to the skin. The medium pressure will allow you to feel the tissue that is a little deeper. The firm pressure will allow you to feel the tissue  close to the ribs.  Continue the overlapping circles, moving downward over the breast until you feel your ribs below your breast.  Move one finger-width toward the center of the body. Continue to use the  inch (2 cm) overlapping circles to feel your breast as you move slowly up toward your collarbone.  Continue the up and down exam using all three pressures until you reach your armpit.  Write Down What You Find  Write down what is normal for each breast and any changes that you find. Keep a written record with breast changes or normal findings for each breast. By writing this information down, you do not need to depend only on memory for size, tenderness, or location. Write down where you are in your menstrual cycle, if you are still menstruating. If you are having trouble noticing differences   in your breasts, do not get discouraged. With time you will become more familiar with the variations in your breasts and more comfortable with the exam. How often should I examine my breasts? Examine your breasts every month. If you are breastfeeding, the best time to examine your breasts is after a feeding or after using a breast pump. If you menstruate, the best time to examine your breasts is 5-7 days after your period is over. During your period, your breasts are lumpier, and it may be more difficult to notice changes. When should I see my health care provider? See your health care provider if you notice:  A change in shape or size of your breasts or nipples.  A change in the skin of your breast or nipples, such as a reddened or scaly area.  Unusual discharge from your nipples.  A lump or thick area that was not there before.  Pain in your breasts.  Anything that concerns you.  

## 2019-12-27 ENCOUNTER — Other Ambulatory Visit (HOSPITAL_COMMUNITY)
Admission: RE | Admit: 2019-12-27 | Discharge: 2019-12-27 | Disposition: A | Discharge status: Home / Self Care | Payer: 59 | Source: Ambulatory Visit | Attending: Obstetrics and Gynecology | Admitting: Obstetrics and Gynecology

## 2019-12-27 DIAGNOSIS — Z113 Encounter for screening for infections with a predominantly sexual mode of transmission: Secondary | ICD-10-CM | POA: Insufficient documentation

## 2019-12-27 LAB — COMPREHENSIVE METABOLIC PANEL
ALT: 20 IU/L (ref 0–32)
AST: 18 IU/L (ref 0–40)
Albumin/Globulin Ratio: 1.7 (ref 1.2–2.2)
Albumin: 4.2 g/dL (ref 3.9–5.0)
Alkaline Phosphatase: 112 IU/L (ref 39–117)
BUN/Creatinine Ratio: 20 (ref 9–23)
BUN: 15 mg/dL (ref 6–20)
Bilirubin Total: 0.2 mg/dL (ref 0.0–1.2)
CO2: 25 mmol/L (ref 20–29)
Calcium: 9.1 mg/dL (ref 8.7–10.2)
Chloride: 105 mmol/L (ref 96–106)
Creatinine, Ser: 0.75 mg/dL (ref 0.57–1.00)
GFR calc Af Amer: 128 mL/min/{1.73_m2} (ref >59)
GFR calc non Af Amer: 111 mL/min/{1.73_m2} (ref >59)
Globulin, Total: 2.5 g/dL (ref 1.5–4.5)
Glucose: 82 mg/dL (ref 65–99)
Potassium: 4.5 mmol/L (ref 3.5–5.2)
Sodium: 140 mmol/L (ref 134–144)
Total Protein: 6.7 g/dL (ref 6.0–8.5)

## 2019-12-27 LAB — LIPID PANEL
Chol/HDL Ratio: 2.4 ratio (ref 0.0–4.4)
Cholesterol, Total: 160 mg/dL (ref 100–199)
HDL: 66 mg/dL (ref >39)
LDL Chol Calc (NIH): 81 mg/dL (ref 0–99)
Triglycerides: 68 mg/dL (ref 0–149)
VLDL Cholesterol Cal: 13 mg/dL (ref 5–40)

## 2019-12-27 LAB — CBC
Hematocrit: 39.9 % (ref 34.0–46.6)
Hemoglobin: 13.3 g/dL (ref 11.1–15.9)
MCH: 27.5 pg (ref 26.6–33.0)
MCHC: 33.3 g/dL (ref 31.5–35.7)
MCV: 82 fL (ref 79–97)
Platelets: 296 10*3/uL (ref 150–450)
RBC: 4.84 x10E6/uL (ref 3.77–5.28)
RDW: 14.2 % (ref 11.7–15.4)
WBC: 5.6 10*3/uL (ref 3.4–10.8)

## 2019-12-27 LAB — VITAMIN D 25 HYDROXY (VIT D DEFICIENCY, FRACTURES): Vit D, 25-Hydroxy: 33.5 ng/mL (ref 30.0–100.0)

## 2019-12-27 LAB — HEMOGLOBIN A1C
Est. average glucose Bld gHb Est-mCnc: 105 mg/dL
Hgb A1c MFr Bld: 5.3 % (ref 4.8–5.6)

## 2019-12-27 LAB — TSH: TSH: 2.09 u[IU]/mL (ref 0.450–4.500)

## 2019-12-27 NOTE — Addendum Note (Signed)
Addended by: Tobi Bastos on: 12/27/2019 08:38 AM   Modules accepted: Orders

## 2019-12-28 LAB — CYTOLOGY - PAP: Diagnosis: NEGATIVE

## 2020-01-05 DIAGNOSIS — M25561 Pain in right knee: Secondary | ICD-10-CM | POA: Insufficient documentation

## 2020-01-14 IMAGING — US ULTRASOUND RIGHT BREAST LIMITED
1 series · 1 of 1 positions shown · non-contrast
Comparison: None

CLINICAL DATA: Patient presents for palpable abnormality within the
medial aspect of the right breast.

EXAM:
ULTRASOUND OF THE RIGHT BREAST

[Series 1: ultrasound right breast limited · 0.08mm/px · 1 of 1 slices shown]
[im 1/1]
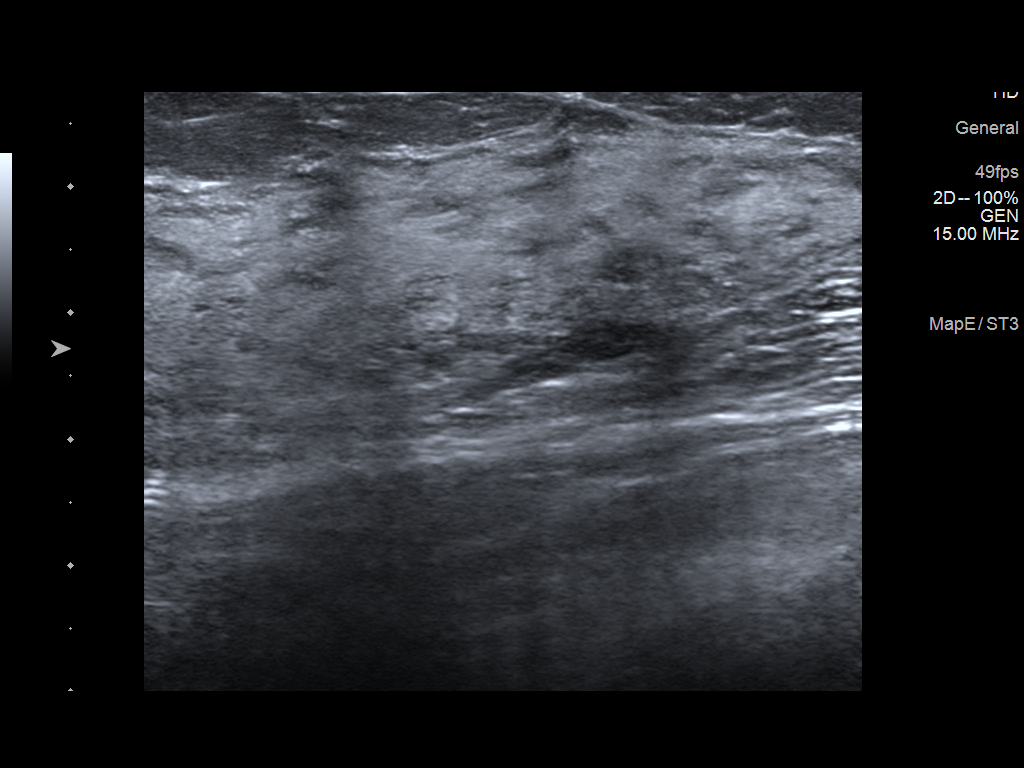

[1 of 1 positions shown; findings below may reference images not displayed]

FINDINGS: On physical exam, dense tissue is palpated within the medial right
breast.

Targeted ultrasound is performed, showing normal dense tissue
without suspicious mass within the right breast 3 o'clock position 2
cm from the nipple the site of palpable concern.
IMPRESSION: No suspicious findings at the site of palpable concern.

RECOMMENDATION:
Continued clinical evaluation for right breast palpable abnormality.

Screening mammogram at age 40 unless there are persistent or
intervening clinical concerns. (Code:OO-D-CB6)

I have discussed the findings and recommendations with the patient.
Results were also provided in writing at the conclusion of the
visit. If applicable, a reminder letter will be sent to the patient
regarding the next appointment.

BI-RADS CATEGORY  1: Negative.

## 2020-02-05 ENCOUNTER — Other Ambulatory Visit: Payer: Self-pay | Admitting: Obstetrics and Gynecology

## 2020-02-13 DIAGNOSIS — M25562 Pain in left knee: Secondary | ICD-10-CM | POA: Insufficient documentation

## 2020-02-14 ENCOUNTER — Other Ambulatory Visit: Payer: Self-pay | Admitting: Obstetrics and Gynecology

## 2020-02-14 DIAGNOSIS — F419 Anxiety disorder, unspecified: Secondary | ICD-10-CM

## 2020-02-14 NOTE — Telephone Encounter (Signed)
Medication refill request: Lexapro 10 mg tablets  Last refilled: 11/15/2020 Last AEX:  12/26/2019, JJ  Next AEX: not scheduled  Last MMG (if hormonal medication request): N/A Refill authorized:  Please advise.   Pended for Lexapro 10 mg #30 rf 11.Refill if appropriate.

## 2020-03-21 ENCOUNTER — Encounter: Payer: Self-pay | Admitting: Obstetrics and Gynecology

## 2020-03-22 ENCOUNTER — Other Ambulatory Visit: Payer: Self-pay

## 2020-03-22 DIAGNOSIS — F419 Anxiety disorder, unspecified: Secondary | ICD-10-CM

## 2020-03-22 NOTE — Telephone Encounter (Signed)
[facility]  Phone Number: [phone]  Hi, Dr. Oscar La,   I hope you are well. I was wondering if you would write a script to put me back on birth control. I have decided that I would like to resume taking it. When I stopped, I was taking Tri-Estarylla and would like to go back on that, if possible.   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), so if [facility] could [facility] [address] [place], [place] [address] that would be fantastic. Could you send my next escitalopram Rx there too, please? It can be listed as my primary pharmacy for now.   Thank you!   [name], PA-C Renee Rival!) :)

## 2020-03-22 NOTE — Telephone Encounter (Signed)
Medication refill request: Tri-Estarylla Last AEX:  12/26/19 JJ Next AEX: none scheduled  Last MMG (if hormonal medication request): n/a Refill authorized: Please advise on OCP.   Will call pharmacy to transfer prescription for Lexapro as it was already sent in March 2021 to a different pharmacy.

## 2020-03-22 NOTE — Telephone Encounter (Signed)
Spoke with pharmacy regarding prescription for Lexapro. Pharmacist states that [facility] [place] [facility] within [facility]. Patient has been notified of this via voicemail. Okay per DPR to leave a detailed message at [phone].

## 2020-04-16 ENCOUNTER — Other Ambulatory Visit: Payer: Self-pay | Admitting: Obstetrics and Gynecology

## 2020-04-16 NOTE — Telephone Encounter (Signed)
AEX 12/2019  Spoke with pt. Pt requesting refills on OCPs. Pt states restarting OCPs after not taking for 2 months. Pt restarted OCPs in Feb 2021. Pt denies any problems, heavy vaginal bleeding or clots or BTB. Pt states has had regular flow and monthly cycles since restarting pills. LMP 03/21/20. Pt has last pack that will end in 1 week for cycle start.   Advised will review with Dr Oscar La and return call once approved or denied. Pt agreeable.   Refills for 1 year if approved. Pended Rx of Tri-Estarylla # 84, 3 RF. Pharmacy verified.   Routing to Dr Oscar La

## 2020-04-16 NOTE — Telephone Encounter (Signed)
Patient would like to start birth control and would like to discuss with a nurse.

## 2020-04-17 MED ORDER — NORGESTIM-ETH ESTRAD TRIPHASIC 0.18/0.215/0.25 MG-35 MCG PO TABS: 1.0000 | ORAL_TABLET | Freq: Every day | ORAL | 2 refills | Status: DC

## 2020-04-17 NOTE — Telephone Encounter (Signed)
Left detailed message per DPR and pt's request. Pt to return call with any questions or concerns.   Encounter closed.

## 2020-12-15 ENCOUNTER — Other Ambulatory Visit: Payer: Self-pay | Admitting: Obstetrics and Gynecology

## 2020-12-17 MED ORDER — NORGESTIM-ETH ESTRAD TRIPHASIC 0.18/0.215/0.25 MG-35 MCG PO TABS: 1.0000 | ORAL_TABLET | Freq: Every day | ORAL | 0 refills | Status: DC

## 2021-03-07 ENCOUNTER — Other Ambulatory Visit: Payer: Self-pay | Admitting: Obstetrics and Gynecology

## 2021-03-07 DIAGNOSIS — F419 Anxiety disorder, unspecified: Secondary | ICD-10-CM

## 2021-03-07 NOTE — Telephone Encounter (Signed)
Spoke with patient in regards to refill request for Lexapro 10 mg tab. Last AEX 12/26/19 w/ Dr. Oscar La.  No AEX scheduled.  Is still taking medication, has approximately 2 wks left. AEX scheduled for 4/18 at 1:30pm w/ Dr. Oscar La. Will wait to see Dr. Oscar La for refill.  Patient thankful for call.  Encounter closed.

## 2021-03-11 ENCOUNTER — Encounter: Payer: Self-pay | Admitting: Obstetrics and Gynecology

## 2021-03-11 ENCOUNTER — Other Ambulatory Visit: Payer: Self-pay

## 2021-03-11 ENCOUNTER — Ambulatory Visit: Payer: No Typology Code available for payment source | Admitting: Obstetrics and Gynecology

## 2021-03-11 VITALS — BP 132/72 | HR 104 | Ht 66.0 in | Wt 262.0 lb

## 2021-03-11 DIAGNOSIS — Z3009 Encounter for other general counseling and advice on contraception: Secondary | ICD-10-CM

## 2021-03-11 DIAGNOSIS — Z6841 Body Mass Index (BMI) 40.0 and over, adult: Secondary | ICD-10-CM

## 2021-03-11 DIAGNOSIS — Z01419 Encounter for gynecological examination (general) (routine) without abnormal findings: Secondary | ICD-10-CM | POA: Diagnosis not present

## 2021-03-11 DIAGNOSIS — Z0000 Encounter for general adult medical examination without abnormal findings: Secondary | ICD-10-CM | POA: Diagnosis not present

## 2021-03-11 DIAGNOSIS — E559 Vitamin D deficiency, unspecified: Secondary | ICD-10-CM

## 2021-03-11 MED ORDER — NORGESTIM-ETH ESTRAD TRIPHASIC 0.18/0.215/0.25 MG-35 MCG PO TABS: 1.0000 | ORAL_TABLET | Freq: Every day | ORAL | 3 refills | Status: DC

## 2021-03-11 NOTE — Patient Instructions (Signed)

## 2021-03-11 NOTE — Progress Notes (Signed)
25 y.o. G0P0000 Single White or Caucasian Not Hispanic or Latino female here for annual exam.  Patient is concerned about weight gain.  She ran out of her birth control pills in 2/22, has had one cycle off of OCP's, due later this month. Prior h/o oligomenorrhea.  Not sexually active since her last visit. Period Cycle (Days): 28 Period Duration (Days): 5 Period Pattern: Regular Menstrual Flow: Moderate Menstrual Control: Tampon Menstrual Control Change Freq (Hours): 3 Dysmenorrhea: None Dysmenorrhea Symptoms: Headache  Patient's last menstrual period was 02/21/2021 (approximate).          Sexually active: No.  The current method of family planning is none.    Exercising: Yes.    Home exercise routine includes yoga. Smoker:  no  Health Maintenance: Pap: 12/27/19 WNL 11/27/16 WNL  History of abnormal Pap:  no MMG:  Ultrasound right breast 04/27/19 Bi-rads 1 Negative  BMD:   NA Colonoscopy: NA TDaP:  11/27/16  Gardasil: Complete per patient    reports that she has never smoked. She has never used smokeless tobacco. She reports current alcohol use. She reports that she does not use drugs. 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   No past medical history on file.  Past Surgical History:  Procedure Laterality Date  . WISDOM TOOTH EXTRACTION      Current Outpatient Medications  Medication Sig Dispense Refill  . cetirizine (ZYRTEC) 5 MG tablet Take 5 mg by mouth daily.    . cholecalciferol (VITAMIN D) 1000 units tablet Take 1,000 Units by mouth daily. 2 tablets daily    . escitalopram (LEXAPRO) 10 MG tablet TAKE 1 TABLET BY MOUTH EVERY DAY 90 tablet 3  . fluticasone (FLONASE) 50 MCG/ACT nasal spray Place 1 spray into both nostrils daily.     No current facility-administered medications for this visit.    Family History  Problem Relation Age of Onset  . Thyroid disease Mother   . Thyroid disease Father   . Colon  polyps Father   . Diabetes Paternal Aunt   . Diabetes Maternal Grandfather   . Cancer Maternal Grandmother     Review of Systems  All other systems reviewed and are negative.   Exam:   BP 132/72   Pulse (!) 104   Ht 5\' 6"  (1.676 m)   Wt 262 lb (118.8 kg)   LMP 02/21/2021 (Approximate)   SpO2 98%   BMI 42.29 kg/m   Weight change: @WEIGHTCHANGE @ Height:   Height: 5\' 6"  (167.6 cm)  Ht Readings from Last 3 Encounters:  03/11/21 5\' 6"  (1.676 m)  12/26/19 5' 5.5" (1.664 m)  12/24/18 5\' 5"  (1.651 m)    General appearance: alert, cooperative and appears stated age Head: Normocephalic, without obvious abnormality, atraumatic Neck: no adenopathy, supple, symmetrical, trachea midline and thyroid normal to inspection and palpation Lungs: clear to auscultation bilaterally Cardiovascular: regular rate and rhythm Breasts: normal appearance, no masses or tenderness Abdomen: soft, non-tender; non distended,  no masses,  no organomegaly Extremities: extremities normal, atraumatic, no cyanosis or edema Skin: Skin color, texture, turgor normal. No rashes or lesions Lymph nodes: Cervical, supraclavicular, and axillary nodes normal. No abnormal inguinal nodes palpated Neurologic: Grossly normal   Pelvic: External genitalia:  no lesions              Urethra:  normal appearing urethra with no masses, tenderness or lesions  Bartholins and Skenes: normal                 Vagina: normal appearing vagina with normal color and discharge, no lesions              Cervix: no lesions               Bimanual Exam:  Uterus:  normal size, contour, position, consistency, mobility, non-tender              Adnexa: no mass, fullness, tenderness               Rectovaginal: Confirms               Anus:  normal sphincter tone, no lesions  Carolynn Serve chaperoned for the exam.  1. Well woman exam Discussed breast self exam Discussed calcium and vit D intake   2. BMI 40.0-44.9, adult  (HCC) -weight loss clinic # given -Discussed exercise and eating healthy - Hemoglobin A1c - TSH - Lipid panel  3. Vitamin D deficiency - VITAMIN D 25 Hydroxy (Vit-D Deficiency, Fractures)  4. Laboratory exam ordered as part of routine general medical examination - CBC - Comprehensive metabolic panel - Lipid panel  5. General counseling and advice on female contraception Wants to restart OCP's - Norgestimate-Ethinyl Estradiol Triphasic 0.18/0.215/0.25 MG-35 MCG tablet; Take 1 tablet by mouth daily.  Dispense: 84 tablet; Refill: 3

## 2021-03-12 LAB — COMPREHENSIVE METABOLIC PANEL
AG Ratio: 1.5 (calc) (ref 1.0–2.5)
ALT: 16 U/L (ref 6–29)
AST: 21 U/L (ref 10–30)
Albumin: 4.2 g/dL (ref 3.6–5.1)
Alkaline phosphatase (APISO): 129 U/L — ABNORMAL HIGH (ref 31–125)
BUN: 12 mg/dL (ref 7–25)
CO2: 24 mmol/L (ref 20–32)
Calcium: 9.5 mg/dL (ref 8.6–10.2)
Chloride: 103 mmol/L (ref 98–110)
Creat: 0.76 mg/dL (ref 0.50–1.10)
Globulin: 2.8 g/dL (calc) (ref 1.9–3.7)
Glucose, Bld: 87 mg/dL (ref 65–99)
Potassium: 4.1 mmol/L (ref 3.5–5.3)
Sodium: 138 mmol/L (ref 135–146)
Total Bilirubin: 0.5 mg/dL (ref 0.2–1.2)
Total Protein: 7 g/dL (ref 6.1–8.1)

## 2021-03-12 LAB — LIPID PANEL
Cholesterol: 190 mg/dL (ref <200)
HDL: 54 mg/dL (ref >=50)
LDL Cholesterol (Calc): 113 mg/dL (calc) — ABNORMAL HIGH
Non-HDL Cholesterol (Calc): 136 mg/dL (calc) — ABNORMAL HIGH (ref <130)
Total CHOL/HDL Ratio: 3.5 (calc) (ref <5.0)
Triglycerides: 120 mg/dL (ref <150)

## 2021-03-12 LAB — HEMOGLOBIN A1C
Hgb A1c MFr Bld: 5.4 % of total Hgb (ref <5.7)
Mean Plasma Glucose: 108 mg/dL
eAG (mmol/L): 6 mmol/L

## 2021-03-12 LAB — CBC
HCT: 41.5 % (ref 35.0–45.0)
Hemoglobin: 13.5 g/dL (ref 11.7–15.5)
MCH: 27.2 pg (ref 27.0–33.0)
MCHC: 32.5 g/dL (ref 32.0–36.0)
MCV: 83.5 fL (ref 80.0–100.0)
MPV: 10.5 fL (ref 7.5–12.5)
Platelets: 328 10*3/uL (ref 140–400)
RBC: 4.97 10*6/uL (ref 3.80–5.10)
RDW: 13 % (ref 11.0–15.0)
WBC: 5.9 10*3/uL (ref 3.8–10.8)

## 2021-03-12 LAB — TSH: TSH: 3.5 mIU/L

## 2021-03-12 LAB — VITAMIN D 25 HYDROXY (VIT D DEFICIENCY, FRACTURES): Vit D, 25-Hydroxy: 29 ng/mL — ABNORMAL LOW (ref 30–100)

## 2021-03-29 ENCOUNTER — Encounter: Payer: Self-pay | Admitting: Obstetrics and Gynecology

## 2021-03-29 DIAGNOSIS — F419 Anxiety disorder, unspecified: Secondary | ICD-10-CM

## 2021-04-01 MED ORDER — ESCITALOPRAM OXALATE 10 MG PO TABS: ORAL_TABLET | ORAL | 3 refills | Status: DC

## 2022-03-19 ENCOUNTER — Other Ambulatory Visit: Payer: Self-pay | Admitting: *Deleted

## 2022-03-19 DIAGNOSIS — Z3009 Encounter for other general counseling and advice on contraception: Secondary | ICD-10-CM

## 2022-03-19 DIAGNOSIS — F419 Anxiety disorder, unspecified: Secondary | ICD-10-CM

## 2022-03-19 MED ORDER — ESCITALOPRAM OXALATE 10 MG PO TABS: ORAL_TABLET | ORAL | 0 refills | Status: DC

## 2022-03-19 MED ORDER — NORGESTIM-ETH ESTRAD TRIPHASIC 0.18/0.215/0.25 MG-35 MCG PO TABS: 1.0000 | ORAL_TABLET | Freq: Every day | ORAL | 0 refills | Status: DC

## 2022-03-19 NOTE — Telephone Encounter (Signed)
Patient annual exam scheduled on 04/01/22 for annual exam requesting refills on below medication  ?

## 2022-03-19 NOTE — Telephone Encounter (Signed)
Patient called requesting refill on generic lexapro 10 mg tablet and BCP. Last seen in 02/2021. Message sent to appointments to schedule. ?

## 2022-03-25 NOTE — Progress Notes (Signed)
26 y.o. G0P0000 Single White or Caucasian Not Hispanic or Latino female here for annual exam.   ?She is seeing a life coach, wants to get her weight down.  ?She has recently learned that she does have a Brca gene in her family, on her Dad's side. There is breast, colon and pancreatic cancer. Dad's cousin's kids.   ?Period Cycle (Days): 30 ?Period Duration (Days): 5 ?Period Pattern: Regular ?Menstrual Flow: Moderate ?Menstrual Control: Tampon ?Menstrual Control Change Freq (Hours): 3 ?Dysmenorrhea: None ? ?Patient's last menstrual period was 02/25/2022.          ?Sexually active: No.  ?The current method of family planning is OCP (estrogen/progesterone).    ?Exercising: Yes.    Gym/ health club routine includes cardio and light weights. ?Smoker:  no ? ?Health Maintenance: ?Pap:  12/27/19 WNL 11/27/16 WNL  ?History of abnormal Pap:  no ?MMG:  Ultrasound right breast 04/27/19 Bi-rads 1 Negative  ?BMD:   n/a ?Colonoscopy: n/a ?TDaP:  11/27/16 ?Gardasil: Complete per patient  ? ? reports that she has never smoked. She has never used smokeless tobacco. She reports current alcohol use. She reports that she does not use drugs. 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 Just bought her first house, lives in [place].  ? ?No past medical history on file. ? ?Past Surgical History:  ?Procedure Laterality Date  ? WISDOM TOOTH EXTRACTION    ? ? ?Current Outpatient Medications  ?Medication Sig Dispense Refill  ? cetirizine (ZYRTEC) 5 MG tablet Take 5 mg by mouth daily.    ? cholecalciferol (VITAMIN D) 1000 units tablet Take 1,000 Units by mouth daily. 2 tablets daily    ? escitalopram (LEXAPRO) 10 MG tablet TAKE 1 TABLET BY MOUTH EVERY DAY 90 tablet 0  ? fluticasone (FLONASE) 50 MCG/ACT nasal spray Place 1 spray into both nostrils daily.    ? Norgestimate-Ethinyl Estradiol Triphasic 0.18/0.215/0.25 MG-35 MCG tablet Take 1 tablet by mouth daily. 84 tablet 0  ? ?No current  facility-administered medications for this visit.  ?Not currently on vit d ? ?Family History  ?Problem Relation Age of Onset  ? Thyroid disease Mother   ? Hypertension Father   ? Diabetes Father   ? Thyroid disease Father   ? Colon polyps Father   ? Diabetes Paternal Aunt   ? Cancer Maternal Grandmother   ? Diabetes Maternal Grandfather   ?PGM pancreatic cancer.  ?P1st cousin once removed with pancreatic cancer  ?P2nd cousins with BRCA gene. ?All of this is related to her PGM side. ?Mom with Barretts esophageus.  ? ?Review of Systems  ?All other systems reviewed and are negative. ? ?Exam:   ?BP 124/72   Pulse 86   Ht '5\' 6"'  (1.676 m)   Wt 260 lb (117.9 kg)   LMP 02/25/2022   SpO2 99%   BMI 41.97 kg/m?   Weight change: '@WEIGHTCHANGE' @ Height:   Height: '5\' 6"'  (167.6 cm)  ?Ht Readings from Last 3 Encounters:  ?04/01/22 '5\' 6"'  (1.676 m)  ?03/11/21 '5\' 6"'  (1.676 m)  ?12/26/19 5' 5.5" (1.664 m)  ? ? ?General appearance: alert, cooperative and appears stated age ?Head: Normocephalic, without obvious abnormality, atraumatic ?Neck: no adenopathy, supple, symmetrical, trachea midline and thyroid normal to inspection and palpation ?Lungs: clear to auscultation bilaterally ?Cardiovascular: regular rate and rhythm ?Breasts: normal appearance, no masses or tenderness ?Abdomen: soft, non-tender; non distended,  no masses,  no organomegaly ?Extremities: extremities normal, atraumatic, no cyanosis or edema ?Skin: Skin color, texture, turgor normal. Mild erythematous rash under bilateral breasts.  ?Lymph nodes: Cervical, supraclavicular, and axillary nodes normal. ?No abnormal inguinal nodes palpated ?Neurologic: Grossly normal ? ? ?Pelvic: External genitalia:  no lesions ?             Urethra:  normal appearing urethra with no masses, tenderness or lesions ?             Bartholins and Skenes: normal    ?             Vagina: normal appearing vagina with normal color and discharge, no lesions ?             Cervix: no lesions ?               ?Bimanual Exam:  Uterus:   no masses or tenderness ?             Adnexa: no mass, fullness, tenderness ?              Rectovaginal: Confirms ?              Anus:  normal sphincter tone, no lesions ? ?Gae Dry chaperoned for the exam. ? ?1. Well woman exam ?Discussed breast self exam ?Discussed calcium and vit D intake ?Pap next year ? ?2. Candidal intertrigo ?- nystatin (MYCOSTATIN/NYSTOP) powder; Apply 1 application. topically 3 (three) times daily. Apply to affected area for up to 7 days  Dispense: 15 g; Refill: 2 ? ?3. BMI 40.0-44.9, adult ([place]) ?Working on weight loss ?- Hemoglobin A1c ?- Lipid panel ?- TSH ? ?4. Vitamin D deficiency ?Not currently taking her vit d ?- VITAMIN D 25 Hydroxy (Vit-D Deficiency, Fractures) ? ?5. Elevated LDL cholesterol level ?- Lipid panel ? ?6. Family history of BRCA gene mutation ?Recommended her father see a Dietitian ? ?7. Laboratory exam ordered as part of routine general medical examination ?- CBC ?- Comprehensive metabolic panel ?- Lipid panel ? ?8. Anxiety ?Doing well on Lexapro ?- escitalopram (LEXAPRO) 10 MG tablet; TAKE 1 TABLET BY MOUTH EVERY DAY  Dispense: 90 tablet; Refill: 3 ? ?9. Encounter for surveillance of contraceptive pills ?Doing well ?- Norgestimate-Ethinyl Estradiol Triphasic 0.18/0.215/0.25 MG-35 MCG tablet; Take 1 tablet by mouth daily.  Dispense: 84 tablet; Refill: 3 ? ? ?

## 2022-04-01 ENCOUNTER — Encounter: Payer: Self-pay | Admitting: Obstetrics and Gynecology

## 2022-04-01 ENCOUNTER — Ambulatory Visit (INDEPENDENT_AMBULATORY_CARE_PROVIDER_SITE_OTHER): Payer: No Typology Code available for payment source | Admitting: Obstetrics and Gynecology

## 2022-04-01 VITALS — BP 124/72 | HR 86 | Ht 66.0 in | Wt 260.0 lb

## 2022-04-01 DIAGNOSIS — Z6841 Body Mass Index (BMI) 40.0 and over, adult: Secondary | ICD-10-CM

## 2022-04-01 DIAGNOSIS — E559 Vitamin D deficiency, unspecified: Secondary | ICD-10-CM

## 2022-04-01 DIAGNOSIS — Z01419 Encounter for gynecological examination (general) (routine) without abnormal findings: Secondary | ICD-10-CM | POA: Diagnosis not present

## 2022-04-01 DIAGNOSIS — Z8481 Family history of carrier of genetic disease: Secondary | ICD-10-CM

## 2022-04-01 DIAGNOSIS — E7800 Pure hypercholesterolemia, unspecified: Secondary | ICD-10-CM

## 2022-04-01 DIAGNOSIS — F419 Anxiety disorder, unspecified: Secondary | ICD-10-CM

## 2022-04-01 DIAGNOSIS — B372 Candidiasis of skin and nail: Secondary | ICD-10-CM | POA: Diagnosis not present

## 2022-04-01 DIAGNOSIS — Z3041 Encounter for surveillance of contraceptive pills: Secondary | ICD-10-CM

## 2022-04-01 DIAGNOSIS — Z0000 Encounter for general adult medical examination without abnormal findings: Secondary | ICD-10-CM

## 2022-04-01 MED ORDER — NYSTATIN 100000 UNIT/GM EX POWD: 1.0000 "application " | Freq: Three times a day (TID) | CUTANEOUS | 2 refills | Status: DC

## 2022-04-01 MED ORDER — NORGESTIM-ETH ESTRAD TRIPHASIC 0.18/0.215/0.25 MG-35 MCG PO TABS: 1.0000 | ORAL_TABLET | Freq: Every day | ORAL | 3 refills | Status: DC

## 2022-04-01 MED ORDER — ESCITALOPRAM OXALATE 10 MG PO TABS: ORAL_TABLET | ORAL | 3 refills | Status: DC

## 2022-04-01 NOTE — Patient Instructions (Signed)

## 2022-04-02 LAB — COMPREHENSIVE METABOLIC PANEL
AG Ratio: 1.7 (calc) (ref 1.0–2.5)
ALT: 12 U/L (ref 6–29)
AST: 13 U/L (ref 10–30)
Albumin: 4.3 g/dL (ref 3.6–5.1)
Alkaline phosphatase (APISO): 95 U/L (ref 31–125)
BUN: 15 mg/dL (ref 7–25)
CO2: 23 mmol/L (ref 20–32)
Calcium: 9.1 mg/dL (ref 8.6–10.2)
Chloride: 104 mmol/L (ref 98–110)
Creat: 0.76 mg/dL (ref 0.50–0.96)
Globulin: 2.5 g/dL (calc) (ref 1.9–3.7)
Glucose, Bld: 78 mg/dL (ref 65–99)
Potassium: 3.7 mmol/L (ref 3.5–5.3)
Sodium: 138 mmol/L (ref 135–146)
Total Bilirubin: 0.5 mg/dL (ref 0.2–1.2)
Total Protein: 6.8 g/dL (ref 6.1–8.1)

## 2022-04-02 LAB — CBC
HCT: 38.6 % (ref 35.0–45.0)
Hemoglobin: 12.7 g/dL (ref 11.7–15.5)
MCH: 27.7 pg (ref 27.0–33.0)
MCHC: 32.9 g/dL (ref 32.0–36.0)
MCV: 84.3 fL (ref 80.0–100.0)
MPV: 11 fL (ref 7.5–12.5)
Platelets: 259 10*3/uL (ref 140–400)
RBC: 4.58 10*6/uL (ref 3.80–5.10)
RDW: 13.4 % (ref 11.0–15.0)
WBC: 10.5 10*3/uL (ref 3.8–10.8)

## 2022-04-02 LAB — LIPID PANEL
Cholesterol: 139 mg/dL (ref <200)
HDL: 53 mg/dL (ref >=50)
LDL Cholesterol (Calc): 70 mg/dL (calc)
Non-HDL Cholesterol (Calc): 86 mg/dL (calc) (ref <130)
Total CHOL/HDL Ratio: 2.6 (calc) (ref <5.0)
Triglycerides: 78 mg/dL (ref <150)

## 2022-04-02 LAB — HEMOGLOBIN A1C
Hgb A1c MFr Bld: 5.3 % of total Hgb (ref <5.7)
Mean Plasma Glucose: 105 mg/dL
eAG (mmol/L): 5.8 mmol/L

## 2022-04-02 LAB — VITAMIN D 25 HYDROXY (VIT D DEFICIENCY, FRACTURES): Vit D, 25-Hydroxy: 32 ng/mL (ref 30–100)

## 2022-04-02 LAB — TSH: TSH: 4.44 mIU/L

## 2023-05-20 ENCOUNTER — Other Ambulatory Visit: Payer: Self-pay

## 2023-05-20 DIAGNOSIS — F419 Anxiety disorder, unspecified: Secondary | ICD-10-CM

## 2023-05-20 DIAGNOSIS — Z3041 Encounter for surveillance of contraceptive pills: Secondary | ICD-10-CM

## 2023-05-20 MED ORDER — ESCITALOPRAM OXALATE 10 MG PO TABS: ORAL_TABLET | ORAL | 0 refills | Status: DC

## 2023-05-20 MED ORDER — NORGESTIM-ETH ESTRAD TRIPHASIC 0.18/0.215/0.25 MG-35 MCG PO TABS: 1.0000 | ORAL_TABLET | Freq: Every day | ORAL | 0 refills | Status: DC

## 2023-05-20 NOTE — Telephone Encounter (Signed)
Medication refill request: escitalopram 10mg , norgestimate-ethinyl estradiol triphasic 0.18/0.215/0.25-35mcg Last AEX:  04-01-22 Next AEX: 07-08-23 with Jami, NP Last MMG (if hormonal medication request): 04-27-19 breast u/s birads 1:neg Refill authorized: please approve if appropriate

## 2023-07-08 ENCOUNTER — Other Ambulatory Visit: Payer: Self-pay | Admitting: Radiology

## 2023-07-08 ENCOUNTER — Other Ambulatory Visit (HOSPITAL_COMMUNITY)
Admission: RE | Admit: 2023-07-08 | Discharge: 2023-07-08 | Disposition: A | Discharge status: Home / Self Care | Payer: 59 | Source: Ambulatory Visit | Attending: Radiology | Admitting: Radiology

## 2023-07-08 ENCOUNTER — Ambulatory Visit (INDEPENDENT_AMBULATORY_CARE_PROVIDER_SITE_OTHER): Payer: No Typology Code available for payment source | Admitting: Radiology

## 2023-07-08 ENCOUNTER — Encounter: Payer: Self-pay | Admitting: Radiology

## 2023-07-08 VITALS — BP 118/66 | Ht 65.0 in | Wt 242.0 lb

## 2023-07-08 DIAGNOSIS — Z6841 Body Mass Index (BMI) 40.0 and over, adult: Secondary | ICD-10-CM | POA: Diagnosis not present

## 2023-07-08 DIAGNOSIS — Z01419 Encounter for gynecological examination (general) (routine) without abnormal findings: Secondary | ICD-10-CM | POA: Diagnosis not present

## 2023-07-08 DIAGNOSIS — F419 Anxiety disorder, unspecified: Secondary | ICD-10-CM | POA: Diagnosis not present

## 2023-07-08 DIAGNOSIS — Z3041 Encounter for surveillance of contraceptive pills: Secondary | ICD-10-CM

## 2023-07-08 MED ORDER — ESCITALOPRAM OXALATE 20 MG PO TABS
20.0000 mg | ORAL_TABLET | Freq: Every day | ORAL | 4 refills | Status: DC
Start: 2023-07-08 — End: 2024-07-20

## 2023-07-08 MED ORDER — NORGESTIM-ETH ESTRAD TRIPHASIC 0.18/0.215/0.25 MG-35 MCG PO TABS: 1.0000 | ORAL_TABLET | Freq: Every day | ORAL | 4 refills | Status: DC

## 2023-07-08 NOTE — Addendum Note (Signed)
Addended by: Rushie Goltz on: 07/08/2023 12:31 PM   Modules accepted: Orders
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
## 2023-07-09 ENCOUNTER — Other Ambulatory Visit: Payer: No Typology Code available for payment source

## 2023-07-09 DIAGNOSIS — Z6841 Body Mass Index (BMI) 40.0 and over, adult: Secondary | ICD-10-CM

## 2023-07-10 LAB — COMPREHENSIVE METABOLIC PANEL
AG Ratio: 1.4 (calc) (ref 1.0–2.5)
ALT: 13 U/L (ref 6–29)
AST: 13 U/L (ref 10–30)
Albumin: 3.9 g/dL (ref 3.6–5.1)
Alkaline phosphatase (APISO): 82 U/L (ref 31–125)
BUN: 15 mg/dL (ref 7–25)
CO2: 23 mmol/L (ref 20–32)
Calcium: 9.1 mg/dL (ref 8.6–10.2)
Chloride: 105 mmol/L (ref 98–110)
Creat: 0.84 mg/dL (ref 0.50–0.96)
Globulin: 2.7 g/dL (ref 1.9–3.7)
Glucose, Bld: 95 mg/dL (ref 65–99)
Potassium: 5 mmol/L (ref 3.5–5.3)
Sodium: 139 mmol/L (ref 135–146)
Total Bilirubin: 0.4 mg/dL (ref 0.2–1.2)
Total Protein: 6.6 g/dL (ref 6.1–8.1)

## 2023-07-10 LAB — LIPID PANEL
Cholesterol: 139 mg/dL (ref <200)
HDL: 70 mg/dL (ref >=50)
LDL Cholesterol (Calc): 56 mg/dL
Non-HDL Cholesterol (Calc): 69 mg/dL (ref <130)
Total CHOL/HDL Ratio: 2 (calc) (ref <5.0)
Triglycerides: 58 mg/dL (ref <150)

## 2023-07-10 LAB — HEMOGLOBIN A1C
Hgb A1c MFr Bld: 5.3 %{Hb} (ref <5.7)
Mean Plasma Glucose: 105 mg/dL
eAG (mmol/L): 5.8 mmol/L

## 2023-07-13 LAB — CYTOLOGY - PAP: Diagnosis: NEGATIVE

## 2024-07-20 ENCOUNTER — Ambulatory Visit (INDEPENDENT_AMBULATORY_CARE_PROVIDER_SITE_OTHER): Payer: PRIVATE HEALTH INSURANCE | Admitting: Radiology

## 2024-07-20 ENCOUNTER — Encounter: Payer: Self-pay | Admitting: Radiology

## 2024-07-20 VITALS — BP 138/98 | HR 105 | Ht 66.0 in | Wt 283.0 lb

## 2024-07-20 DIAGNOSIS — Z01419 Encounter for gynecological examination (general) (routine) without abnormal findings: Secondary | ICD-10-CM

## 2024-07-20 DIAGNOSIS — Z6841 Body Mass Index (BMI) 40.0 and over, adult: Secondary | ICD-10-CM | POA: Diagnosis not present

## 2024-07-20 DIAGNOSIS — F419 Anxiety disorder, unspecified: Secondary | ICD-10-CM

## 2024-07-20 DIAGNOSIS — R030 Elevated blood-pressure reading, without diagnosis of hypertension: Secondary | ICD-10-CM

## 2024-07-20 DIAGNOSIS — Z1331 Encounter for screening for depression: Secondary | ICD-10-CM | POA: Diagnosis not present

## 2024-07-20 MED ORDER — ESCITALOPRAM OXALATE 20 MG PO TABS: 20.0000 mg | ORAL_TABLET | Freq: Every day | ORAL | 4 refills | Status: AC

## 2024-07-20 NOTE — Patient Instructions (Signed)

## 2024-07-20 NOTE — Progress Notes (Signed)
 Kathryn Jenkins [birth year] [id number]   History:  28 y.o. G0 presents for annual exam.40lbs weight gain in the past year. Increased stress with move and new job. Headaches with menses. Would like fasting labs today.  Gynecologic History Patient's last menstrual period was 07/13/2024 (exact date). Period Cycle (Days): 28 (28 with COC) Period Duration (Days): 5-7 Period Pattern: Regular Menstrual Flow: Moderate Menstrual Control: Tampon Dysmenorrhea: (!) Mild Dysmenorrhea Symptoms: Cramping, Headache (back pain) Contraception/Family planning: abstinence and OCP (estrogen/progesterone) Sexually active: no Last Pap: 8/24. Results were: normal   Obstetric History OB History  Gravida Para Term Preterm AB Living  0 0 0 0 0 0  SAB IAB Ectopic Multiple Live Births  0 0 0 0 0       07/20/2024    3:41 PM  Depression screen PHQ 2/9  Decreased Interest 1  Down, Depressed, Hopeless 0  PHQ - 2 Score 1     The following portions of the patient's history were reviewed and updated as appropriate: allergies, current medications, past family history, past medical history, past social history, past surgical history, and problem list.  Review of Systems  All other systems reviewed and are negative.   Past medical history, past surgical history, family history and social history were all reviewed and documented in the EPIC chart.  Exam:  Vitals:   07/20/24 1541 07/20/24 1546 07/20/24 1606  BP: (!) 140/102 (!) 142/94 (!) 138/98  Pulse: (!) 105    SpO2: 98%    Weight: 283 lb (128.4 kg)    Height: 5' 6 (1.676 m)     Body mass index is 45.68 kg/m.  Physical Exam Vitals and nursing note reviewed. Exam conducted with a chaperone present.  Constitutional:      Appearance: Normal appearance. She is obese.  HENT:     Head: Normocephalic and atraumatic.  Neck:     Thyroid : No thyroid  mass, thyromegaly or thyroid  tenderness.  Cardiovascular:     Rate and Rhythm: Regular rhythm.      Heart sounds: Normal heart sounds.  Pulmonary:     Effort: Pulmonary effort is normal.     Breath sounds: Normal breath sounds.  Chest:  Breasts:    Breasts are symmetrical.     Right: Normal. No inverted nipple, mass, nipple discharge, skin change or tenderness.     Left: Normal. No inverted nipple, mass, nipple discharge, skin change or tenderness.  Abdominal:     General: Abdomen is flat. Bowel sounds are normal.     Palpations: Abdomen is soft.  Genitourinary:    General: Normal vulva.     Vagina: Normal. No vaginal discharge, bleeding or lesions.     Cervix: Normal. No discharge or lesion.     Uterus: Normal. Not enlarged and not tender.      Adnexa: Right adnexa normal and left adnexa normal.       Right: No mass, tenderness or fullness.         Left: No mass, tenderness or fullness.    Lymphadenopathy:     Upper Body:     Right upper body: No axillary adenopathy.     Left upper body: No axillary adenopathy.  Skin:    General: Skin is warm and dry.  Neurological:     Mental Status: She is alert and oriented to person, place, and time.  Psychiatric:        Mood and Affect: Mood normal.        Thought Content: Thought  content normal.        Judgment: Judgment normal.      Darice Hoit, CMA present for exam  Assessment/Plan:   1. Well woman exam with routine gynecological exam (Primary) Pap 2027  2. BMI 45.0-49.9, adult ([facility]CC) - Hemoglobin A1c - CBC with Differential/Platelet - Thyroid  Panel With TSH - Comprehensive metabolic panel with GFR - Lipid panel  3. Anxiety May try decreasing to 10mg  to help with weight - escitalopram  (LEXAPRO ) 20 MG tablet; Take 1 tablet (20 mg total) by mouth daily.  Dispense: 90 tablet; Refill: 4  4. Elevated blood pressure reading BP elevated x 3 Will stop OCPs today Establish with PCP Weight loss Warning signs reviewed    Return in about 1 year (around 07/20/2025) for Annual.  GINETTE COZIER B WHNP-BC 4:26 PM 07/20/2024

## 2024-07-21 ENCOUNTER — Ambulatory Visit: Payer: Self-pay | Admitting: Radiology

## 2024-07-21 ENCOUNTER — Other Ambulatory Visit: Payer: Self-pay | Admitting: Radiology

## 2024-07-21 DIAGNOSIS — Z6841 Body Mass Index (BMI) 40.0 and over, adult: Secondary | ICD-10-CM

## 2024-07-21 LAB — COMPREHENSIVE METABOLIC PANEL WITH GFR
AG Ratio: 1.3 (calc) (ref 1.0–2.5)
ALT: 12 U/L (ref 6–29)
AST: 16 U/L (ref 10–30)
Albumin: 3.9 g/dL (ref 3.6–5.1)
Alkaline phosphatase (APISO): 85 U/L (ref 31–125)
BUN: 16 mg/dL (ref 7–25)
CO2: 26 mmol/L (ref 20–32)
Calcium: 9.2 mg/dL (ref 8.6–10.2)
Chloride: 102 mmol/L (ref 98–110)
Creat: 0.75 mg/dL (ref 0.50–0.96)
Globulin: 3 g/dL (ref 1.9–3.7)
Glucose, Bld: 84 mg/dL (ref 65–99)
Potassium: 4.3 mmol/L (ref 3.5–5.3)
Sodium: 138 mmol/L (ref 135–146)
Total Bilirubin: 0.4 mg/dL (ref 0.2–1.2)
Total Protein: 6.9 g/dL (ref 6.1–8.1)
eGFR: 110 mL/min/1.73m2 (ref >=60)

## 2024-07-21 LAB — LIPID PANEL
Cholesterol: 216 mg/dL — ABNORMAL HIGH (ref <200)
HDL: 68 mg/dL (ref >=50)
LDL Cholesterol (Calc): 122 mg/dL — ABNORMAL HIGH
Non-HDL Cholesterol (Calc): 148 mg/dL — ABNORMAL HIGH (ref <130)
Total CHOL/HDL Ratio: 3.2 (calc) (ref <5.0)
Triglycerides: 144 mg/dL (ref <150)

## 2024-07-21 LAB — CBC WITH DIFFERENTIAL/PLATELET
Absolute Lymphocytes: 1510 {cells}/uL (ref 850–3900)
Absolute Monocytes: 461 {cells}/uL (ref 200–950)
Basophils Absolute: 32 {cells}/uL (ref 0–200)
Basophils Relative: 0.5 %
Eosinophils Absolute: 32 {cells}/uL (ref 15–500)
Eosinophils Relative: 0.5 %
HCT: 43.8 % (ref 35.0–45.0)
Hemoglobin: 14.2 g/dL (ref 11.7–15.5)
MCH: 28.1 pg (ref 27.0–33.0)
MCHC: 32.4 g/dL (ref 32.0–36.0)
MCV: 86.7 fL (ref 80.0–100.0)
MPV: 10.5 fL (ref 7.5–12.5)
Monocytes Relative: 7.2 %
Neutro Abs: 4365 {cells}/uL (ref 1500–7800)
Neutrophils Relative %: 68.2 %
Platelets: 319 Thousand/uL (ref 140–400)
RBC: 5.05 Million/uL (ref 3.80–5.10)
RDW: 12.2 % (ref 11.0–15.0)
Total Lymphocyte: 23.6 %
WBC: 6.4 Thousand/uL (ref 3.8–10.8)

## 2024-07-21 LAB — HEMOGLOBIN A1C
Hgb A1c MFr Bld: 5.4 % (ref <5.7)
Mean Plasma Glucose: 108 mg/dL
eAG (mmol/L): 6 mmol/L

## 2024-07-21 LAB — THYROID PANEL WITH TSH
Free Thyroxine Index: 2.8 (ref 1.4–3.8)
T4, Total: 10.4 ug/dL (ref 5.1–11.9)
TSH: 1.07 m[IU]/L
TSH: 27 m[IU]/L (ref 22–35)

## 2024-07-21 MED ORDER — ZEPBOUND 2.5 MG/0.5ML ~~LOC~~ SOAJ: 2.5000 mg | SUBCUTANEOUS | 0 refills | Status: DC

## 2024-08-03 ENCOUNTER — Telehealth: Payer: Self-pay

## 2024-08-03 NOTE — Telephone Encounter (Signed)
 Prior authorization request received from covermymeds for Zepbound  2.5 mg. PA initiated. KEY: [id number] DX: [id number]

## 2024-08-03 NOTE — Telephone Encounter (Signed)
 PA for Zepbound  2.5 mg was approved.  Patient notified. Message from Plan PA Case: [id number], Status: Approved, Coverage Starts on: 08/03/2024 12:00:00 AM, Coverage Ends on: 08/03/2025 12:00:00 AM.. Authorization Expiration Date: August 03, 2025.

## 2024-08-26 ENCOUNTER — Other Ambulatory Visit: Payer: Self-pay | Admitting: Radiology

## 2024-08-26 DIAGNOSIS — Z6841 Body Mass Index (BMI) 40.0 and over, adult: Secondary | ICD-10-CM

## 2024-08-26 MED ORDER — ZEPBOUND 5 MG/0.5ML ~~LOC~~ SOAJ: 5.0000 mg | SUBCUTANEOUS | 0 refills | Status: DC

## 2024-08-26 NOTE — Telephone Encounter (Signed)
 Please contact patient to see if she is tolerating well, will increase to 5mg .

## 2024-08-26 NOTE — Telephone Encounter (Signed)
 Spoke to patient, patient states she is doing well with Zepbound  2.5 mg.  She desires increased dose.  Sent to provider for review.

## 2024-08-26 NOTE — Telephone Encounter (Signed)
 New Rx sent. Will need a weight check in 4 weeks.

## 2024-08-26 NOTE — Telephone Encounter (Signed)
 Med refill request: Zepbound  2.5 mg Last AEX: 07/20/24 Next AEX: 07/26/25 Last MMG (if hormonal med) n/a Last refill: 08/03/2024 Refill authorized: Please Advise?

## 2024-08-26 NOTE — Telephone Encounter (Signed)
 Pt aware script was sent in and that she needs to have a wt check in 4 wks.  Pt also sent a mychart message about refilling the medication.

## 2024-09-26 ENCOUNTER — Other Ambulatory Visit: Payer: Self-pay | Admitting: Radiology

## 2024-09-26 DIAGNOSIS — Z6841 Body Mass Index (BMI) 40.0 and over, adult: Secondary | ICD-10-CM

## 2024-09-26 NOTE — Telephone Encounter (Signed)
 Med refill request:   tirzepatide  (ZEPBOUND ) 5 MG/0.5ML Pen  Start:  08/26/24 Disp:   2 mL Refills:  0  Last AEX:  07/20/24 Next OV:  10/05/24 Next AEX:  07/26/25  Last MMG (if hormonal med):  N/A Refill authorized? Please Advise.

## 2024-09-26 NOTE — Telephone Encounter (Signed)
 Please contact patient and see if she is tolerating well and if she would like to increase.

## 2024-09-28 ENCOUNTER — Other Ambulatory Visit: Payer: Self-pay | Admitting: Radiology

## 2024-09-28 DIAGNOSIS — Z6841 Body Mass Index (BMI) 40.0 and over, adult: Secondary | ICD-10-CM

## 2024-09-28 MED ORDER — ZEPBOUND 5 MG/0.5ML ~~LOC~~ SOAJ
5.0000 mg | SUBCUTANEOUS | 0 refills | Status: DC
Start: 2024-09-28 — End: 2024-10-05

## 2024-10-05 ENCOUNTER — Ambulatory Visit: Payer: PRIVATE HEALTH INSURANCE | Admitting: Radiology

## 2024-10-05 ENCOUNTER — Encounter: Payer: Self-pay | Admitting: Radiology

## 2024-10-05 VITALS — BP 122/76 | Wt 272.0 lb

## 2024-10-05 DIAGNOSIS — E6609 Other obesity due to excess calories: Secondary | ICD-10-CM | POA: Diagnosis not present

## 2024-10-05 DIAGNOSIS — Z6841 Body Mass Index (BMI) 40.0 and over, adult: Secondary | ICD-10-CM | POA: Diagnosis not present

## 2024-10-05 MED ORDER — ZEPBOUND 7.5 MG/0.5ML ~~LOC~~ SOAJ: 7.5000 mg | SUBCUTANEOUS | 1 refills | Status: AC

## 2024-10-05 MED ORDER — ZEPBOUND 10 MG/0.5ML ~~LOC~~ SOAJ: 10.0000 mg | SUBCUTANEOUS | 1 refills | Status: AC

## 2024-10-05 NOTE — Telephone Encounter (Signed)
 tirzepatide  (ZEPBOUND ) 5 MG/0.5ML Pen   Request already responded to by other means. Therefore, this refill request has been refused.

## 2024-10-05 NOTE — Progress Notes (Signed)
   Kathryn Jenkins 1996/07/03 [id number]   History:  28 y.o. G0 presents for weight management. Has lost 11lbs on zepbound  since starting 3 months ago. Working with a nurse, adult. Focuses on high protein and high fiber. Doing weights and cardio multiple times a week. Has an active job, walks a lot in the hospital. Very happy with her progress. Ready to increase to the next dose, no side effects.  Gynecologic History Patient's last menstrual period was 09/30/2024 (approximate).  Obstetric History OB History  Gravida Para Term Preterm AB Living  0 0 0 0 0 0  SAB IAB Ectopic Multiple Live Births  0 0 0 0 0     Review of Systems  All other systems reviewed and are negative.   Past medical history, past surgical history, family history and social history were all reviewed and documented in the EPIC chart.  Exam:  Vitals:   10/05/24 1108  BP: 122/76  Weight: 272 lb (123.4 kg)   Body mass index is 43.9 kg/m.  Physical Exam Constitutional:      Appearance: Normal appearance. She is obese.  Pulmonary:     Effort: Pulmonary effort is normal.  Neurological:     Mental Status: She is alert.  Psychiatric:        Mood and Affect: Mood normal.        Thought Content: Thought content normal.        Judgment: Judgment normal.    Starting weight:283 Current weight:272 First goal:250 Overall weight loss goal:200  Assessment/Plan:   1. BMI 45.0-49.9, adult (HCC) (Primary) - tirzepatide  (ZEPBOUND ) 7.5 MG/0.5ML Pen; Inject 7.5 mg into the skin once a week.  Dispense: 2 mL; Refill: 1 - tirzepatide  (ZEPBOUND ) 10 MG/0.5ML Pen; Inject 10 mg into the skin once a week.  Dispense: 2 mL; Refill: 1    Risks and benefits discussed. Continue to focus on protein and fiber. 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 Small frequent meals. Adequate hydration and electrolyte replacement. Will continue to exercise for at least a week including weight bearing exercise.    Return in about 12 weeks (around 12/28/2024) for Med Follow-up .  GINETTE COZIER B WHNP-BC 11:28 AM 10/05/2024

## 2025-01-04 ENCOUNTER — Ambulatory Visit: Payer: PRIVATE HEALTH INSURANCE | Admitting: Radiology

## 2025-07-26 ENCOUNTER — Ambulatory Visit: Payer: PRIVATE HEALTH INSURANCE | Admitting: Radiology
